# Patient Record
Sex: Female | Born: 1990
Health system: Southern US, Community
[De-identification: ages and names within clinical notes are randomized; demographics above are authoritative.]

## PROBLEM LIST (undated history)

## (undated) ENCOUNTER — Inpatient Hospital Stay (HOSPITAL_COMMUNITY): Payer: Self-pay

## (undated) DIAGNOSIS — D573 Sickle-cell trait: Secondary | ICD-10-CM

## (undated) DIAGNOSIS — N390 Urinary tract infection, site not specified: Secondary | ICD-10-CM

## (undated) DIAGNOSIS — Z789 Other specified health status: Secondary | ICD-10-CM

## (undated) DIAGNOSIS — N12 Tubulo-interstitial nephritis, not specified as acute or chronic: Secondary | ICD-10-CM

## (undated) DIAGNOSIS — IMO0002 Reserved for concepts with insufficient information to code with codable children: Secondary | ICD-10-CM

## (undated) DIAGNOSIS — R87619 Unspecified abnormal cytological findings in specimens from cervix uteri: Secondary | ICD-10-CM

## (undated) HISTORY — DX: Reserved for concepts with insufficient information to code with codable children: IMO0002

## (undated) HISTORY — DX: Unspecified abnormal cytological findings in specimens from cervix uteri: R87.619

## (undated) HISTORY — PX: COLPOSCOPY: SHX161

## (undated) HISTORY — DX: Sickle-cell trait: D57.3

---
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
## 2009-09-04 ENCOUNTER — Inpatient Hospital Stay (HOSPITAL_COMMUNITY): Admission: AD | Admit: 2009-09-04 | Discharge: 2009-09-04 | Payer: Self-pay | Admitting: Obstetrics & Gynecology

## 2009-09-05 ENCOUNTER — Inpatient Hospital Stay (HOSPITAL_COMMUNITY): Admission: AD | Admit: 2009-09-05 | Discharge: 2009-09-07 | Payer: Self-pay | Admitting: Obstetrics

## 2009-09-12 ENCOUNTER — Inpatient Hospital Stay (HOSPITAL_COMMUNITY): Admission: AD | Admit: 2009-09-12 | Discharge: 2009-09-16 | Payer: Self-pay | Admitting: Obstetrics

## 2010-04-14 ENCOUNTER — Emergency Department (HOSPITAL_COMMUNITY)
Admission: EM | Admit: 2010-04-14 | Discharge: 2010-04-14 | Payer: Self-pay | Source: Home / Self Care | Admitting: Emergency Medicine

## 2010-07-07 LAB — URINALYSIS, ROUTINE W REFLEX MICROSCOPIC
Bilirubin Urine: NEGATIVE
Glucose, UA: NEGATIVE mg/dL
Protein, ur: NEGATIVE mg/dL
Urobilinogen, UA: 0.2 mg/dL (ref 0.0–1.0)

## 2010-07-14 LAB — DIFFERENTIAL
Basophils Absolute: 0 10*3/uL (ref 0.0–0.1)
Basophils Relative: 0 % (ref 0–1)
Lymphocytes Relative: 21 % (ref 12–46)
Neutro Abs: 6.3 10*3/uL (ref 1.7–7.7)
Neutrophils Relative %: 66 % (ref 43–77)

## 2010-07-14 LAB — COMPREHENSIVE METABOLIC PANEL
ALT: 26 U/L (ref 0–35)
AST: 20 U/L (ref 0–37)
Albumin: 2.5 g/dL — ABNORMAL LOW (ref 3.5–5.2)
Alkaline Phosphatase: 108 U/L (ref 39–117)
BUN: 15 mg/dL (ref 6–23)
Chloride: 101 mEq/L (ref 96–112)
Chloride: 105 mEq/L (ref 96–112)
Creatinine, Ser: 1.38 mg/dL — ABNORMAL HIGH (ref 0.4–1.2)
GFR calc Af Amer: 59 mL/min — ABNORMAL LOW (ref >60)
Glucose, Bld: 120 mg/dL — ABNORMAL HIGH (ref 70–99)
Potassium: 3.5 mEq/L (ref 3.5–5.1)
Sodium: 133 mEq/L — ABNORMAL LOW (ref 135–145)
Total Bilirubin: 0.2 mg/dL — ABNORMAL LOW (ref 0.3–1.2)
Total Protein: 6.2 g/dL (ref 6.0–8.3)
Total Protein: 7.5 g/dL (ref 6.0–8.3)

## 2010-07-14 LAB — CBC
HCT: 25.9 % — ABNORMAL LOW (ref 36.0–46.0)
HCT: 30.9 % — ABNORMAL LOW (ref 36.0–46.0)
Hemoglobin: 8.9 g/dL — ABNORMAL LOW (ref 12.0–15.0)
MCHC: 34.6 g/dL (ref 30.0–36.0)
MCV: 92 fL (ref 78.0–100.0)
Platelets: 371 10*3/uL (ref 150–400)
RBC: 2.81 MIL/uL — ABNORMAL LOW (ref 3.87–5.11)
RBC: 2.87 MIL/uL — ABNORMAL LOW (ref 3.87–5.11)
RDW: 14.1 % (ref 11.5–15.5)
RDW: 14.5 % (ref 11.5–15.5)
RDW: 14.9 % (ref 11.5–15.5)
WBC: 17.5 10*3/uL — ABNORMAL HIGH (ref 4.0–10.5)
WBC: 25.3 10*3/uL — ABNORMAL HIGH (ref 4.0–10.5)

## 2010-07-14 LAB — URINALYSIS, ROUTINE W REFLEX MICROSCOPIC
Glucose, UA: NEGATIVE mg/dL
Protein, ur: 30 mg/dL — AB
Specific Gravity, Urine: 1.01 (ref 1.005–1.030)
Urobilinogen, UA: 0.2 mg/dL (ref 0.0–1.0)

## 2010-07-14 LAB — CULTURE, BLOOD (ROUTINE X 2): Culture: NO GROWTH

## 2010-07-14 LAB — BASIC METABOLIC PANEL
CO2: 22 mEq/L (ref 19–32)
Calcium: 8.2 mg/dL — ABNORMAL LOW (ref 8.4–10.5)
Creatinine, Ser: 0.81 mg/dL (ref 0.4–1.2)
GFR calc Af Amer: >60 mL/min (ref >60)
GFR calc non Af Amer: >60 mL/min (ref >60)
Glucose, Bld: 109 mg/dL — ABNORMAL HIGH (ref 70–99)

## 2010-07-14 LAB — URINE CULTURE: Colony Count: >=100000

## 2010-07-14 LAB — URINE MICROSCOPIC-ADD ON

## 2010-07-15 LAB — CBC
HCT: 30 % — ABNORMAL LOW (ref 36.0–46.0)
Hemoglobin: 10.4 g/dL — ABNORMAL LOW (ref 12.0–15.0)
Hemoglobin: 11.4 g/dL — ABNORMAL LOW (ref 12.0–15.0)
MCV: 93.7 fL (ref 78.0–100.0)
RBC: 3.2 MIL/uL — ABNORMAL LOW (ref 3.87–5.11)
RBC: 3.62 MIL/uL — ABNORMAL LOW (ref 3.87–5.11)
WBC: 20.6 10*3/uL — ABNORMAL HIGH (ref 4.0–10.5)

## 2010-10-14 ENCOUNTER — Inpatient Hospital Stay (INDEPENDENT_AMBULATORY_CARE_PROVIDER_SITE_OTHER)
Admission: RE | Admit: 2010-10-14 | Discharge: 2010-10-14 | Disposition: A | Discharge status: Home / Self Care | Payer: Medicaid Other | Source: Ambulatory Visit | Attending: Family Medicine | Admitting: Family Medicine

## 2010-10-14 DIAGNOSIS — N898 Other specified noninflammatory disorders of vagina: Secondary | ICD-10-CM

## 2010-10-14 DIAGNOSIS — N390 Urinary tract infection, site not specified: Secondary | ICD-10-CM

## 2010-10-14 LAB — POCT URINALYSIS DIP (DEVICE)
Hgb urine dipstick: NEGATIVE
Ketones, ur: NEGATIVE mg/dL
Protein, ur: NEGATIVE mg/dL
Specific Gravity, Urine: 1.015 (ref 1.005–1.030)
pH: 6.5 (ref 5.0–8.0)

## 2010-10-14 LAB — WET PREP, GENITAL

## 2010-10-14 LAB — POCT PREGNANCY, URINE: Preg Test, Ur: NEGATIVE

## 2010-11-25 ENCOUNTER — Inpatient Hospital Stay (INDEPENDENT_AMBULATORY_CARE_PROVIDER_SITE_OTHER)
Admission: RE | Admit: 2010-11-25 | Discharge: 2010-11-25 | Disposition: A | Discharge status: Home / Self Care | Payer: Medicaid Other | Source: Ambulatory Visit | Attending: Family Medicine | Admitting: Family Medicine

## 2010-11-25 DIAGNOSIS — R6889 Other general symptoms and signs: Secondary | ICD-10-CM

## 2010-11-25 LAB — WET PREP, GENITAL: Yeast Wet Prep HPF POC: NONE SEEN

## 2010-11-26 LAB — POCT URINALYSIS DIP (DEVICE)
Hgb urine dipstick: NEGATIVE
Ketones, ur: NEGATIVE mg/dL
Protein, ur: NEGATIVE mg/dL
Specific Gravity, Urine: 1.015 (ref 1.005–1.030)
pH: 6.5 (ref 5.0–8.0)

## 2010-11-26 LAB — POCT PREGNANCY, URINE: Preg Test, Ur: NEGATIVE

## 2010-12-03 ENCOUNTER — Inpatient Hospital Stay (INDEPENDENT_AMBULATORY_CARE_PROVIDER_SITE_OTHER)
Admission: RE | Admit: 2010-12-03 | Discharge: 2010-12-03 | Disposition: A | Discharge status: Home / Self Care | Payer: Medicaid Other | Source: Ambulatory Visit | Attending: Emergency Medicine | Admitting: Emergency Medicine

## 2010-12-03 DIAGNOSIS — J069 Acute upper respiratory infection, unspecified: Secondary | ICD-10-CM

## 2011-02-05 LAB — POCT URINALYSIS DIP (DEVICE)
Bilirubin Urine: NEGATIVE
Glucose, UA: NEGATIVE
Ketones, ur: NEGATIVE
Operator id: 116391
Specific Gravity, Urine: 1.025
Urobilinogen, UA: 1

## 2011-02-05 LAB — GC/CHLAMYDIA PROBE AMP, GENITAL
Chlamydia, DNA Probe: NEGATIVE
GC Probe Amp, Genital: NEGATIVE

## 2011-02-05 LAB — WET PREP, GENITAL

## 2011-02-05 LAB — POCT PREGNANCY, URINE
Operator id: 116391
Preg Test, Ur: NEGATIVE

## 2011-04-28 NOTE — L&D Delivery Note (Signed)
Delivery Note At 12:57 PM a viable unspecified sex was delivered via Vaginal, Spontaneous Delivery (Presentation: ;  ).  APGAR: , ; weight .   Placenta status: Intact, Spontaneous.  Cord: 3 vessels with the following complications: .  Cord pH: not done  Anesthesia: Epidural  Episiotomy:  Lacerations:  Suture Repair: 2.0 Est. Blood Loss (mL):   Mom to postpartum.  Baby to nursery-stable.  MARSHALL,BERNARD A 12/06/2011, 1:02 PM

## 2011-05-12 LAB — OB RESULTS CONSOLE RPR: RPR: NONREACTIVE

## 2011-05-12 LAB — OB RESULTS CONSOLE ABO/RH: RH Type: POSITIVE

## 2011-05-12 LAB — OB RESULTS CONSOLE HIV ANTIBODY (ROUTINE TESTING): HIV: NONREACTIVE

## 2011-07-06 ENCOUNTER — Other Ambulatory Visit: Payer: Self-pay | Admitting: Obstetrics

## 2011-07-06 DIAGNOSIS — Z3689 Encounter for other specified antenatal screening: Secondary | ICD-10-CM

## 2011-07-10 ENCOUNTER — Ambulatory Visit (HOSPITAL_COMMUNITY): Payer: Medicaid Other

## 2011-07-14 ENCOUNTER — Ambulatory Visit (HOSPITAL_COMMUNITY)
Admission: RE | Admit: 2011-07-14 | Discharge: 2011-07-14 | Disposition: A | Discharge status: Home / Self Care | Payer: Medicaid Other | Source: Ambulatory Visit | Attending: Obstetrics | Admitting: Obstetrics

## 2011-07-14 DIAGNOSIS — Z1389 Encounter for screening for other disorder: Secondary | ICD-10-CM | POA: Insufficient documentation

## 2011-07-14 DIAGNOSIS — Z3689 Encounter for other specified antenatal screening: Secondary | ICD-10-CM

## 2011-07-14 DIAGNOSIS — Z363 Encounter for antenatal screening for malformations: Secondary | ICD-10-CM | POA: Insufficient documentation

## 2011-07-14 DIAGNOSIS — O358XX0 Maternal care for other (suspected) fetal abnormality and damage, not applicable or unspecified: Secondary | ICD-10-CM | POA: Insufficient documentation

## 2011-09-11 ENCOUNTER — Inpatient Hospital Stay (HOSPITAL_COMMUNITY)
Admission: AD | Admit: 2011-09-11 | Discharge: 2011-09-11 | Disposition: A | Discharge status: Home / Self Care | Payer: Medicaid Other | Source: Ambulatory Visit | Attending: Obstetrics | Admitting: Obstetrics

## 2011-09-11 ENCOUNTER — Encounter (HOSPITAL_COMMUNITY): Payer: Self-pay | Admitting: *Deleted

## 2011-09-11 DIAGNOSIS — O98919 Unspecified maternal infectious and parasitic disease complicating pregnancy, unspecified trimester: Secondary | ICD-10-CM

## 2011-09-11 DIAGNOSIS — J40 Bronchitis, not specified as acute or chronic: Secondary | ICD-10-CM

## 2011-09-11 DIAGNOSIS — R05 Cough: Secondary | ICD-10-CM | POA: Insufficient documentation

## 2011-09-11 DIAGNOSIS — B999 Unspecified infectious disease: Secondary | ICD-10-CM

## 2011-09-11 DIAGNOSIS — J209 Acute bronchitis, unspecified: Secondary | ICD-10-CM | POA: Insufficient documentation

## 2011-09-11 DIAGNOSIS — O99891 Other specified diseases and conditions complicating pregnancy: Secondary | ICD-10-CM | POA: Insufficient documentation

## 2011-09-11 DIAGNOSIS — R059 Cough, unspecified: Secondary | ICD-10-CM | POA: Insufficient documentation

## 2011-09-11 DIAGNOSIS — IMO0002 Reserved for concepts with insufficient information to code with codable children: Secondary | ICD-10-CM

## 2011-09-11 MED ORDER — AZITHROMYCIN 250 MG PO TABS: ORAL_TABLET | ORAL | Status: DC

## 2011-09-11 MED ORDER — AZITHROMYCIN 250 MG PO TABS: ORAL_TABLET | ORAL | Status: AC

## 2011-09-11 MED ORDER — GUAIFENESIN-CODEINE 100-10 MG/5ML PO SYRP: 5.0000 mL | ORAL_SOLUTION | Freq: Three times a day (TID) | ORAL | Status: AC | PRN

## 2011-09-11 NOTE — Discharge Instructions (Signed)
Take the medication as directed and follow up with Dr. Clearance Coots. Be sure you are drinking plenty of fluids.  Bronchitis Bronchitis is a problem of the air tubes leading to your lungs. This problem makes it hard for air to get in and out of the lungs. You may cough a lot because your air tubes are narrow. Going without care can cause lasting (chronic) bronchitis. HOME CARE   Drink enough fluids to keep your pee (urine) clear or pale yellow.   Use a cool mist humidifier.   Quit smoking if you smoke. If you keep smoking, the bronchitis might not get better.   Only take medicine as told by your doctor.  GET HELP RIGHT AWAY IF:   Coughing keeps you awake.   You start to wheeze.   You become more sick or weak.   You have a hard time breathing or get short of breath.   You cough up blood.   Coughing lasts more than 2 weeks.   You have a fever.   Your baby is older than 3 months with a rectal temperature of 102 F (38.9 C) or higher.   Your baby is 30 months old or younger with a rectal temperature of 100.4 F (38 C) or higher.  MAKE SURE YOU:  Understand these instructions.   Will watch your condition.   Will get help right away if you are not doing well or get worse.  Document Released: 09/30/2007 Document Revised: 04/02/2011 Document Reviewed: 03/15/2009 [facility] Patient Information 2012 [place], [place].

## 2011-09-11 NOTE — MAU Note (Signed)
Pt states she started with sore throat and progressed to runny nose, chest pains cough and body aches

## 2011-09-11 NOTE — MAU Note (Signed)
PT CALLED OFFICE - TO ASK ABOUT DRINKING A CERTAIN TYPE OF TEA.    HAS HAD HEAD CONGESTION - STARTED ON Sunday- RUNNY NOSE, SORE THROAT, BODY ACHES, COUGHING,. NO N/V/D/.

## 2011-09-11 NOTE — MAU Provider Note (Signed)
History     CSN: [id number]  Arrival date & time 09/11/11  0006   None     Chief Complaint  Patient presents with  . URI    HPI Connie Duffy is a 21 y.o. female @ [redacted]w[redacted]d gestation who presents to MAU for cough, cold and congestion that started 5 days ago. She started with a sore throat and then began the cough and nasal drainage. Denies fever or chills. Denies any problems with the pregnancy. The history was provided by the patient.  History reviewed. No pertinent past medical history.  Past Surgical History  Procedure Date  . No past surgeries     No family history on file.  History  Substance Use Topics  . Smoking status: Former Smoker    Quit date: 09/10/2009  . Smokeless tobacco: Not on file  . Alcohol Use: No    OB History    Grav Para Term Preterm Abortions TAB SAB Ect Mult Living   2 1 1       1       Review of Systems  Constitutional: Negative for fever, chills, diaphoresis and fatigue.  HENT: Positive for congestion, sore throat and postnasal drip. Negative for ear pain, facial swelling, neck pain, neck stiffness, dental problem and sinus pressure.   Eyes: Negative for photophobia, pain and discharge.  Respiratory: Positive for cough. Negative for chest tightness and wheezing.   Gastrointestinal: Negative for nausea, vomiting, abdominal pain, diarrhea, constipation and abdominal distention.  Genitourinary: Negative for dysuria, urgency, frequency, flank pain, vaginal bleeding, vaginal discharge, difficulty urinating and pelvic pain.  Musculoskeletal: Negative for myalgias, back pain and gait problem.  Skin: Negative for color change and rash.  Neurological: Positive for headaches. Negative for dizziness, speech difficulty, weakness, light-headedness and numbness.  Psychiatric/Behavioral: Negative for confusion and agitation. The patient is not nervous/anxious.     Allergies  Review of patient's allergies indicates no known allergies.  Home Medications     Current Outpatient Rx  Name Route Sig Dispense Refill  . AZITHROMYCIN 250 MG PO TABS  Take 2 tablets PO the first day and then one tablet daily for infection 6 each 0  . GUAIFENESIN-CODEINE 100-10 MG/5ML PO SYRP Oral Take 5 mLs by mouth 3 (three) times daily as needed for cough. 120 mL 0    BP 114/64  Pulse 102  Temp(Src) 98.5 F (36.9 C) (Oral)  Resp 20  Ht 5\' 2"  (1.575 m)  Wt 148 lb (67.132 kg)  BMI 27.07 kg/m2  SpO2 100%  Physical Exam  Nursing note and vitals reviewed. Constitutional: She is oriented to person, place, and time. She appears well-developed and well-nourished. No distress.  HENT:  Head: Normocephalic.  Mouth/Throat: Uvula is midline, oropharynx is clear and moist and mucous membranes are normal. No posterior oropharyngeal edema or posterior oropharyngeal erythema.  Eyes: EOM are normal.  Neck: Trachea normal and normal range of motion. Neck supple.  Cardiovascular:       tachycardia  Pulmonary/Chest: Effort normal. No respiratory distress. She has decreased breath sounds. She has no wheezes.       Occasional ronchi  Abdominal: Soft. There is no tenderness.       Gravid consistent with dates  Musculoskeletal: Normal range of motion.  Lymphadenopathy:    She has no cervical adenopathy.  Neurological: She is alert and oriented to person, place, and time. No cranial nerve deficit.  Skin: Skin is warm and dry.  Psychiatric: She has a normal mood and  affect. Her behavior is normal. Judgment and thought content normal.    EFM: Base line FH 150, no contractions, reactive  Assessment:  Acute Bronchitis   Pregnancy  Plan:  Z-Pak Rx   Robitussin AC Rx   Follow up with Dr. Clearance Coots, return here as needed. I have reviewed this patient's vital signs, nurses notes, appropriate labs.   ED Course  Procedures   MDM

## 2011-09-11 NOTE — Progress Notes (Signed)
Pt states when she coughs she gets a bad headache and bad chest pains

## 2011-11-05 LAB — OB RESULTS CONSOLE GBS: GBS: POSITIVE

## 2011-12-03 ENCOUNTER — Telehealth (HOSPITAL_COMMUNITY): Payer: Self-pay | Admitting: *Deleted

## 2011-12-03 ENCOUNTER — Other Ambulatory Visit: Payer: Self-pay | Admitting: Obstetrics

## 2011-12-03 ENCOUNTER — Encounter (HOSPITAL_COMMUNITY): Payer: Self-pay | Admitting: *Deleted

## 2011-12-03 NOTE — Telephone Encounter (Signed)
Preadmission screen  

## 2011-12-06 ENCOUNTER — Inpatient Hospital Stay (HOSPITAL_COMMUNITY): Payer: Medicaid Other | Admitting: Anesthesiology

## 2011-12-06 ENCOUNTER — Encounter (HOSPITAL_COMMUNITY): Payer: Self-pay | Admitting: Anesthesiology

## 2011-12-06 ENCOUNTER — Inpatient Hospital Stay (HOSPITAL_COMMUNITY)
Admission: AD | Admit: 2011-12-06 | Discharge: 2011-12-08 | DRG: 775 | Disposition: A | Discharge status: Home / Self Care | Payer: Medicaid Other | Source: Ambulatory Visit | Attending: Obstetrics | Admitting: Obstetrics

## 2011-12-06 ENCOUNTER — Encounter (HOSPITAL_COMMUNITY): Payer: Self-pay | Admitting: *Deleted

## 2011-12-06 DIAGNOSIS — O99892 Other specified diseases and conditions complicating childbirth: Principal | ICD-10-CM | POA: Diagnosis present

## 2011-12-06 DIAGNOSIS — Z22330 Carrier of Group B streptococcus: Secondary | ICD-10-CM

## 2011-12-06 LAB — RPR: RPR Ser Ql: NONREACTIVE

## 2011-12-06 LAB — CBC
MCH: 27.4 pg (ref 26.0–34.0)
MCHC: 32.7 g/dL (ref 30.0–36.0)
Platelets: 246 10*3/uL (ref 150–400)
RDW: 14 % (ref 11.5–15.5)

## 2011-12-06 MED ORDER — PHENYLEPHRINE 40 MCG/ML (10ML) SYRINGE FOR IV PUSH (FOR BLOOD PRESSURE SUPPORT)
80.0000 ug | PREFILLED_SYRINGE | INTRAVENOUS | Status: DC | PRN
  Filled 2011-12-06: qty 5

## 2011-12-06 MED ORDER — OXYTOCIN BOLUS FROM INFUSION
250.0000 mL | Freq: Once | INTRAVENOUS | Status: AC
  Administered 2011-12-06: 250 mL via INTRAVENOUS
  Filled 2011-12-06: qty 500

## 2011-12-06 MED ORDER — LIDOCAINE HCL (PF) 1 % IJ SOLN
INTRAMUSCULAR | Status: DC | PRN
  Administered 2011-12-06 (×2): 5 mL

## 2011-12-06 MED ORDER — PRENATAL MULTIVITAMIN CH
1.0000 | ORAL_TABLET | Freq: Every day | ORAL | Status: DC
  Administered 2011-12-06 – 2011-12-08 (×3): 1 via ORAL
  Filled 2011-12-06 (×3): qty 1

## 2011-12-06 MED ORDER — EPHEDRINE 5 MG/ML INJ: 10.0000 mg | INTRAVENOUS | Status: DC | PRN

## 2011-12-06 MED ORDER — ACETAMINOPHEN 325 MG PO TABS: 650.0000 mg | ORAL_TABLET | ORAL | Status: DC | PRN

## 2011-12-06 MED ORDER — MIDAZOLAM HCL 2 MG/2ML IJ SOLN: 0.5000 mg | Freq: Once | INTRAMUSCULAR | Status: AC | PRN

## 2011-12-06 MED ORDER — LIDOCAINE HCL (PF) 1 % IJ SOLN
30.0000 mL | INTRAMUSCULAR | Status: DC | PRN
  Filled 2011-12-06: qty 30

## 2011-12-06 MED ORDER — MEPERIDINE HCL 25 MG/ML IJ SOLN: 6.2500 mg | INTRAMUSCULAR | Status: DC | PRN

## 2011-12-06 MED ORDER — OXYTOCIN 40 UNITS IN LACTATED RINGERS INFUSION - SIMPLE MED
62.5000 mL/h | Freq: Once | INTRAVENOUS | Status: AC
  Administered 2011-12-06: 62.5 mL/h via INTRAVENOUS
  Filled 2011-12-06: qty 1000

## 2011-12-06 MED ORDER — KETOROLAC TROMETHAMINE 30 MG/ML IJ SOLN: 15.0000 mg | Freq: Once | INTRAMUSCULAR | Status: AC | PRN

## 2011-12-06 MED ORDER — ONDANSETRON HCL 4 MG/2ML IJ SOLN: 4.0000 mg | INTRAMUSCULAR | Status: DC | PRN

## 2011-12-06 MED ORDER — DIBUCAINE 1 % RE OINT: 1.0000 "application " | TOPICAL_OINTMENT | RECTAL | Status: DC | PRN

## 2011-12-06 MED ORDER — DIPHENHYDRAMINE HCL 50 MG/ML IJ SOLN: 12.5000 mg | INTRAMUSCULAR | Status: DC | PRN

## 2011-12-06 MED ORDER — LACTATED RINGERS IV SOLN: 500.0000 mL | Freq: Once | INTRAVENOUS | Status: DC

## 2011-12-06 MED ORDER — PENICILLIN G POTASSIUM 5000000 UNITS IJ SOLR
5.0000 10*6.[IU] | Freq: Once | INTRAVENOUS | Status: AC
  Administered 2011-12-06: 5 10*6.[IU] via INTRAVENOUS
  Filled 2011-12-06: qty 5

## 2011-12-06 MED ORDER — OXYCODONE-ACETAMINOPHEN 5-325 MG PO TABS: 1.0000 | ORAL_TABLET | ORAL | Status: DC | PRN

## 2011-12-06 MED ORDER — FENTANYL 2.5 MCG/ML BUPIVACAINE 1/10 % EPIDURAL INFUSION (WH - ANES)
14.0000 mL/h | INTRAMUSCULAR | Status: DC
  Administered 2011-12-06 (×3): 14 mL/h via EPIDURAL
  Filled 2011-12-06 (×3): qty 60

## 2011-12-06 MED ORDER — SENNOSIDES-DOCUSATE SODIUM 8.6-50 MG PO TABS
2.0000 | ORAL_TABLET | Freq: Every day | ORAL | Status: DC
  Administered 2011-12-06 – 2011-12-07 (×2): 2 via ORAL

## 2011-12-06 MED ORDER — LACTATED RINGERS IV SOLN
INTRAVENOUS | Status: DC
  Administered 2011-12-06: 09:00:00 via INTRAVENOUS

## 2011-12-06 MED ORDER — PHENYLEPHRINE 40 MCG/ML (10ML) SYRINGE FOR IV PUSH (FOR BLOOD PRESSURE SUPPORT): 80.0000 ug | PREFILLED_SYRINGE | INTRAVENOUS | Status: DC | PRN

## 2011-12-06 MED ORDER — PENICILLIN G POTASSIUM 5000000 UNITS IJ SOLR
2.5000 10*6.[IU] | INTRAVENOUS | Status: DC
  Administered 2011-12-06 (×2): 2.5 10*6.[IU] via INTRAVENOUS
  Filled 2011-12-06 (×6): qty 2.5

## 2011-12-06 MED ORDER — LACTATED RINGERS IV SOLN
500.0000 mL | INTRAVENOUS | Status: DC | PRN
  Administered 2011-12-06: 500 mL via INTRAVENOUS

## 2011-12-06 MED ORDER — TETANUS-DIPHTH-ACELL PERTUSSIS 5-2.5-18.5 LF-MCG/0.5 IM SUSP
0.5000 mL | Freq: Once | INTRAMUSCULAR | Status: AC
  Administered 2011-12-07: 0.5 mL via INTRAMUSCULAR
  Filled 2011-12-06: qty 0.5

## 2011-12-06 MED ORDER — BENZOCAINE-MENTHOL 20-0.5 % EX AERO: 1.0000 "application " | INHALATION_SPRAY | CUTANEOUS | Status: DC | PRN

## 2011-12-06 MED ORDER — PROMETHAZINE HCL 25 MG/ML IJ SOLN: 6.2500 mg | INTRAMUSCULAR | Status: DC | PRN

## 2011-12-06 MED ORDER — FENTANYL CITRATE 0.05 MG/ML IJ SOLN: 25.0000 ug | INTRAMUSCULAR | Status: DC | PRN

## 2011-12-06 MED ORDER — IBUPROFEN 600 MG PO TABS
600.0000 mg | ORAL_TABLET | Freq: Four times a day (QID) | ORAL | Status: DC | PRN
  Filled 2011-12-06 (×6): qty 1

## 2011-12-06 MED ORDER — CITRIC ACID-SODIUM CITRATE 334-500 MG/5ML PO SOLN: 30.0000 mL | ORAL | Status: DC | PRN

## 2011-12-06 MED ORDER — ONDANSETRON HCL 4 MG PO TABS: 4.0000 mg | ORAL_TABLET | ORAL | Status: DC | PRN

## 2011-12-06 MED ORDER — IBUPROFEN 600 MG PO TABS
600.0000 mg | ORAL_TABLET | Freq: Four times a day (QID) | ORAL | Status: DC
  Administered 2011-12-06 – 2011-12-08 (×7): 600 mg via ORAL
  Filled 2011-12-06 (×2): qty 1

## 2011-12-06 MED ORDER — EPHEDRINE 5 MG/ML INJ
10.0000 mg | INTRAVENOUS | Status: DC | PRN
  Filled 2011-12-06: qty 4

## 2011-12-06 MED ORDER — SIMETHICONE 80 MG PO CHEW: 80.0000 mg | CHEWABLE_TABLET | ORAL | Status: DC | PRN

## 2011-12-06 MED ORDER — ONDANSETRON HCL 4 MG/2ML IJ SOLN: 4.0000 mg | Freq: Four times a day (QID) | INTRAMUSCULAR | Status: DC | PRN

## 2011-12-06 MED ORDER — ZOLPIDEM TARTRATE 5 MG PO TABS: 5.0000 mg | ORAL_TABLET | Freq: Every evening | ORAL | Status: DC | PRN

## 2011-12-06 MED ORDER — DIPHENHYDRAMINE HCL 25 MG PO CAPS: 25.0000 mg | ORAL_CAPSULE | Freq: Four times a day (QID) | ORAL | Status: DC | PRN

## 2011-12-06 MED ORDER — LANOLIN HYDROUS EX OINT: TOPICAL_OINTMENT | CUTANEOUS | Status: DC | PRN

## 2011-12-06 MED ORDER — WITCH HAZEL-GLYCERIN EX PADS: 1.0000 "application " | MEDICATED_PAD | CUTANEOUS | Status: DC | PRN

## 2011-12-06 MED ORDER — FLEET ENEMA 7-19 GM/118ML RE ENEM: 1.0000 | ENEMA | RECTAL | Status: DC | PRN

## 2011-12-06 MED ORDER — FERROUS SULFATE 325 (65 FE) MG PO TABS
325.0000 mg | ORAL_TABLET | Freq: Two times a day (BID) | ORAL | Status: DC
  Administered 2011-12-06 – 2011-12-08 (×4): 325 mg via ORAL
  Filled 2011-12-06 (×4): qty 1

## 2011-12-06 NOTE — MAU Note (Signed)
Started leaking fld about ago and then contractions started

## 2011-12-06 NOTE — Progress Notes (Signed)
Report called to Christy RN in BS. Pt to BS via w/c 

## 2011-12-06 NOTE — Anesthesia Preprocedure Evaluation (Signed)

## 2011-12-06 NOTE — Anesthesia Procedure Notes (Signed)
Epidural Patient location during procedure: OB Start time: 12/06/2011 4:05 AM  Staffing Anesthesiologist: Brayton Caves R Performed by: anesthesiologist   Preanesthetic Checklist Completed: patient identified, site marked, surgical consent, pre-op evaluation, timeout performed, IV checked, risks and benefits discussed and monitors and equipment checked  Epidural Patient position: sitting Prep: site prepped and draped and DuraPrep Patient monitoring: continuous pulse ox and blood pressure Approach: midline Injection technique: LOR air and LOR saline  Needle:  Needle type: Tuohy  Needle gauge: 17 G Needle length: 9 cm Needle insertion depth: 4 cm Catheter type: closed end flexible Catheter size: 19 Gauge Catheter at skin depth: 10 cm Test dose: negative  Assessment Events: blood not aspirated, injection not painful, no injection resistance, negative IV test and no paresthesia  Additional Notes Patient identified.  Risk benefits discussed including failed block, incomplete pain control, headache, nerve damage, paralysis, blood pressure changes, nausea, vomiting, reactions to medication both toxic or allergic, and postpartum back pain.  Patient expressed understanding and wished to proceed.  All questions were answered.  Sterile technique used throughout procedure and epidural site dressed with sterile barrier dressing. No paresthesia or other complications noted.The patient did not experience any signs of intravascular injection such as tinnitus or metallic taste in mouth nor signs of intrathecal spread such as rapid motor block. Please see nursing notes for vital signs.

## 2011-12-06 NOTE — H&P (Signed)
This is Dr. Francoise Ceo dictating the history and physical on  Connie Duffy is a 21 year old gravida 2 para 1001 at 40 weeks and 3 days her due date is 12/03/2011 patch is admitted with ruptured membranes clear fluid and him cervix 2-3 cm 80% with the vertex at a - station positive GBS she is receiving penicillin and she is contracting irregularly on are all Past medical history negative Past surgical history negative Social history negative System review noncontributory Physical exam revealed a well-developed female in labor HEENT negative Breasts negative Heart regular and and no murmurs no gallops Lungs clear to P&A Abdomen term Pelvic as described above Extremities negative

## 2011-12-07 ENCOUNTER — Inpatient Hospital Stay (HOSPITAL_COMMUNITY): Admission: RE | Admit: 2011-12-07 | Payer: Medicaid Other | Source: Ambulatory Visit

## 2011-12-07 LAB — CBC
MCH: 27.3 pg (ref 26.0–34.0)
MCHC: 33.2 g/dL (ref 30.0–36.0)
MCV: 82.3 fL (ref 78.0–100.0)
Platelets: 204 10*3/uL (ref 150–400)
RDW: 14.2 % (ref 11.5–15.5)
WBC: 21.2 10*3/uL — ABNORMAL HIGH (ref 4.0–10.5)

## 2011-12-07 NOTE — Progress Notes (Signed)
Post Partum Day 1 Subjective: no complaints  Objective: Blood pressure 117/64, pulse 102, temperature 98.1 F (36.7 C), temperature source Oral, resp. rate 18, height 5\' 2"  (1.575 m), weight 73.12 kg (161 lb 3.2 oz), SpO2 99.00%, unknown if currently breastfeeding.  Physical Exam:  General: alert and no distress Lochia: appropriate Uterine Fundus: firm Incision: healing well DVT Evaluation: No evidence of DVT seen on physical exam.   Basename 12/07/11 0510 12/06/11 0323  HGB 6.3* 9.0*  HCT 19.0* 27.5*    Assessment/Plan: Plan for discharge tomorrow   LOS: 1 day   HARPER,CHARLES A 12/07/2011, 8:52 AM

## 2011-12-07 NOTE — Anesthesia Postprocedure Evaluation (Signed)
  Anesthesia Post-op Note  Patient: Connie Duffy  Procedure(s) Performed: * No procedures listed *  Patient Location: PACU and Mother/Baby  Anesthesia Type: Epidural  Level of Consciousness: awake, alert  and oriented  Airway and Oxygen Therapy: Patient Spontanous Breathing  Post-op Pain: none  Post-op Assessment: Post-op Vital signs reviewed, Patient's Cardiovascular Status Stable, No headache, No backache, No residual numbness and No residual motor weakness  Post-op Vital Signs: Reviewed and stable  Complications: No apparent anesthesia complications

## 2011-12-07 NOTE — Progress Notes (Signed)
CRITICAL VALUE Alert Critical value received:  Hgb 6.3  Date of notification:  12-07-2011  Time of notification:  0600  Critical value read back:yes  Nurse who received alert:  Hart Carwin  MD notified (1st page):  N/A  Time of first page:  N/A  MD notified (2nd page):  Time of second page:  Responding MD:  N/A  Time MD responded:  N/A

## 2011-12-07 NOTE — Progress Notes (Signed)
Ur chart review completed.  

## 2011-12-08 MED ORDER — FUSION PLUS PO CAPS: 1.0000 | ORAL_CAPSULE | Freq: Every day | ORAL | Status: DC

## 2011-12-08 MED ORDER — OXYCODONE-ACETAMINOPHEN 5-325 MG PO TABS: 1.0000 | ORAL_TABLET | ORAL | Status: AC | PRN

## 2011-12-08 MED ORDER — IBUPROFEN 600 MG PO TABS: 600.0000 mg | ORAL_TABLET | Freq: Four times a day (QID) | ORAL | Status: DC

## 2011-12-08 NOTE — Discharge Summary (Signed)
Obstetric Discharge Summary Reason for Admission: onset of labor Prenatal Procedures: ultrasound Intrapartum Procedures: spontaneous vaginal delivery Postpartum Procedures: none Complications-Operative and Postpartum: none Hemoglobin  Date Value Range Status  12/07/2011 6.3* 12.0 - 15.0 g/dL Final     DELTA CHECK NOTED     REPEATED TO VERIFY     CRITICAL RESULT CALLED TO, READ BACK BY AND VERIFIED WITH:     J STANIN 12/07/11 AT 0556 BY H SOEWARDIMAN     HCT  Date Value Range Status  12/07/2011 19.0* 36.0 - 46.0 % Final    Physical Exam:  General: alert and no distress Lochia: appropriate Uterine Fundus: firm Incision: healing well DVT Evaluation: No evidence of DVT seen on physical exam.  Discharge Diagnoses: Term Pregnancy-delivered  Discharge Information: Date: 12/08/2011 Activity: pelvic rest Diet: routine Medications: PNV, Ibuprofen, Colace, Iron and Percocet Condition: stable Instructions: refer to practice specific booklet Discharge to: home Follow-up Information    Follow up with Riyansh Gerstner A, MD. Schedule an appointment as soon as possible for a visit in 6 weeks.   Contact information:   [address] [place] [address] [phone]          Newborn Data: Live born female  Birth Weight: 8 lb 13.8 oz (4020 g) APGAR: 8, 9  Home with mother.  Davontae Prusinski A 12/08/2011, 9:03 AM

## 2011-12-08 NOTE — Progress Notes (Signed)
Post Partum Day 2 Subjective: no complaints  Objective: Blood pressure 105/65, pulse 90, temperature 98 F (36.7 C), temperature source Oral, resp. rate 18, height 5\' 2"  (1.575 m), weight 73.12 kg (161 lb 3.2 oz), SpO2 100.00%, unknown if currently breastfeeding.  Physical Exam:  General: alert and no distress Lochia: appropriate Uterine Fundus: firm Incision: healing well DVT Evaluation: No evidence of DVT seen on physical exam.   Basename 12/07/11 0510 12/06/11 0323  HGB 6.3* 9.0*  HCT 19.0* 27.5*    Assessment/Plan: Discharge home   LOS: 2 days   Mishael Krysiak A 12/08/2011, 8:56 AM

## 2012-01-01 IMAGING — CR DG CHEST 2V
2 series · 2 of 2 positions shown · non-contrast
Comparison: None

CLINICAL DATA: Back pain, right posterior rib pain

CHEST - 2 VIEW

[w chest pa]
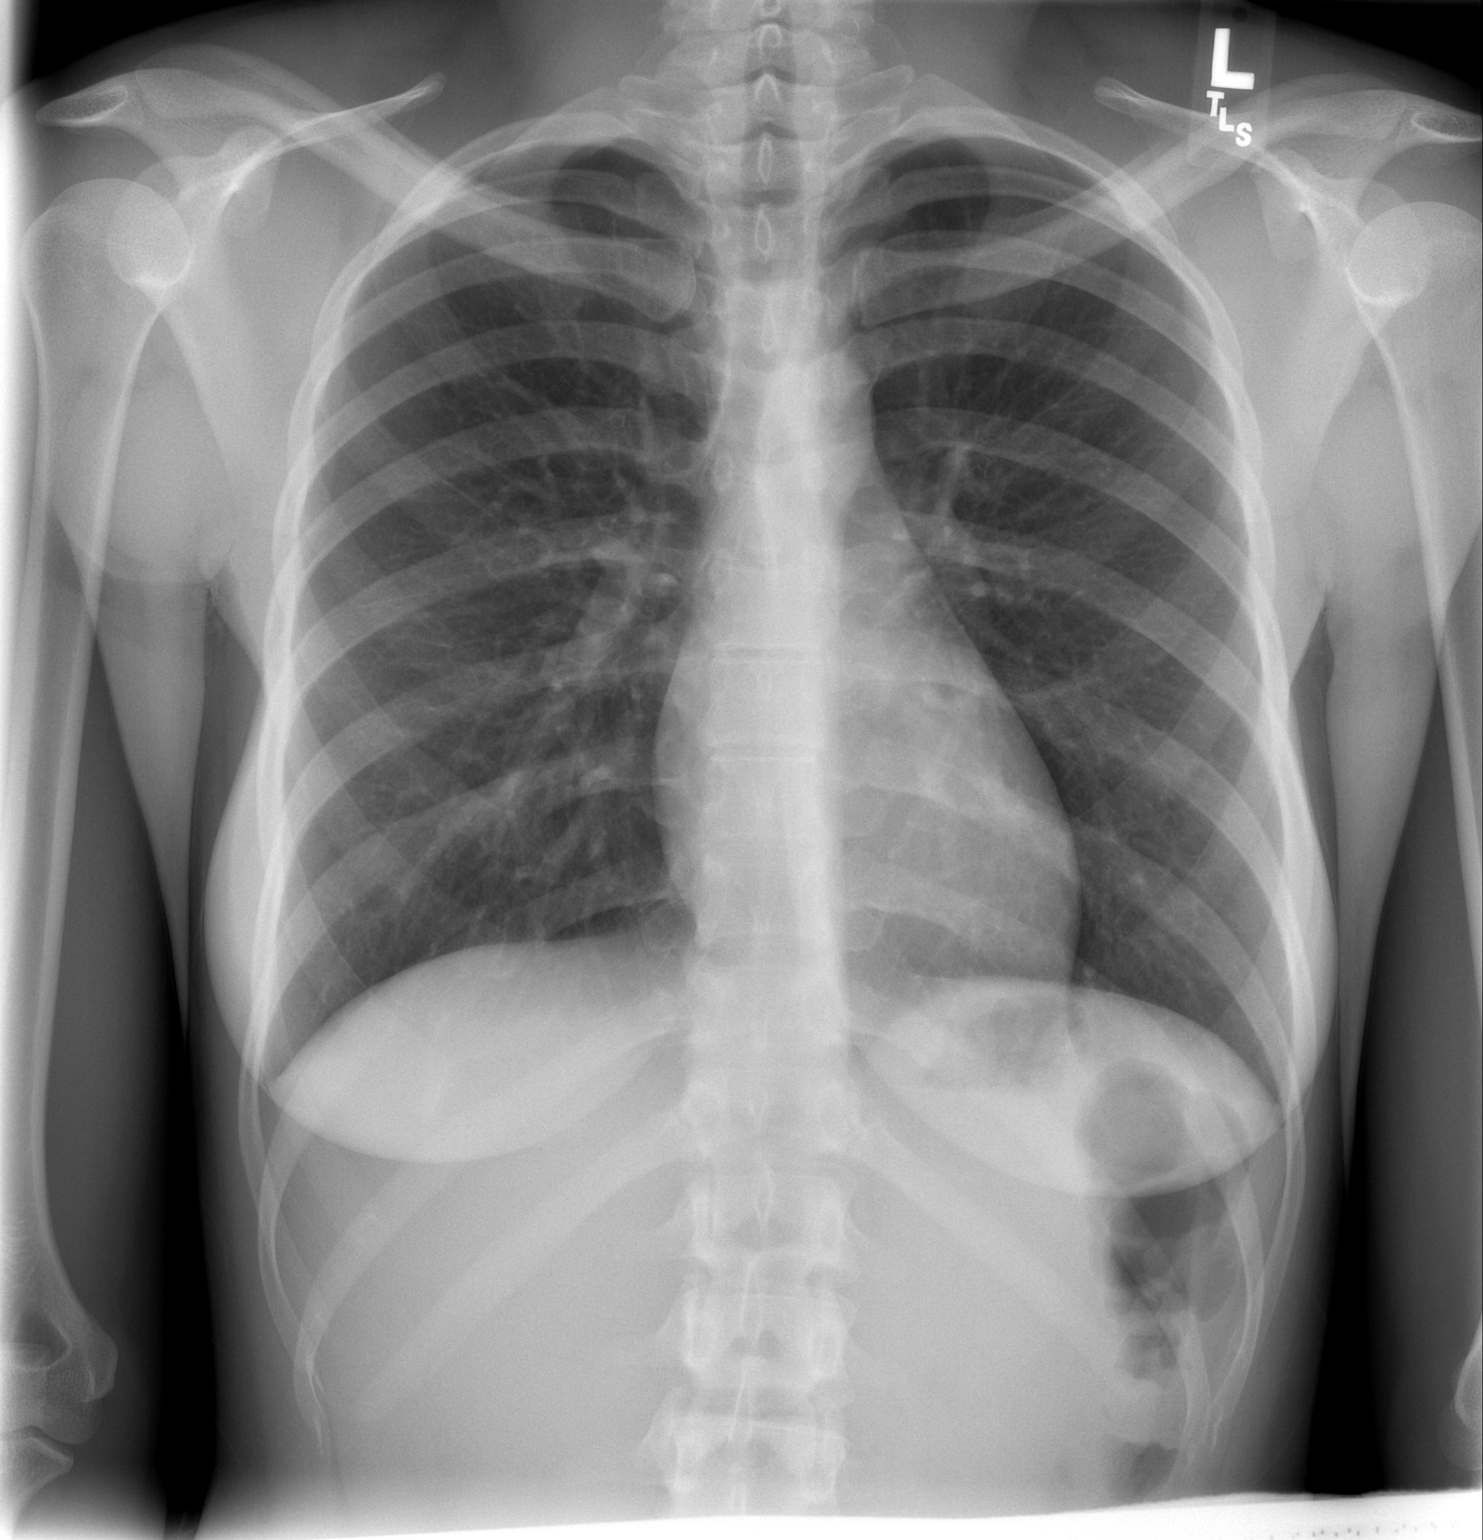

[w chest lat *]
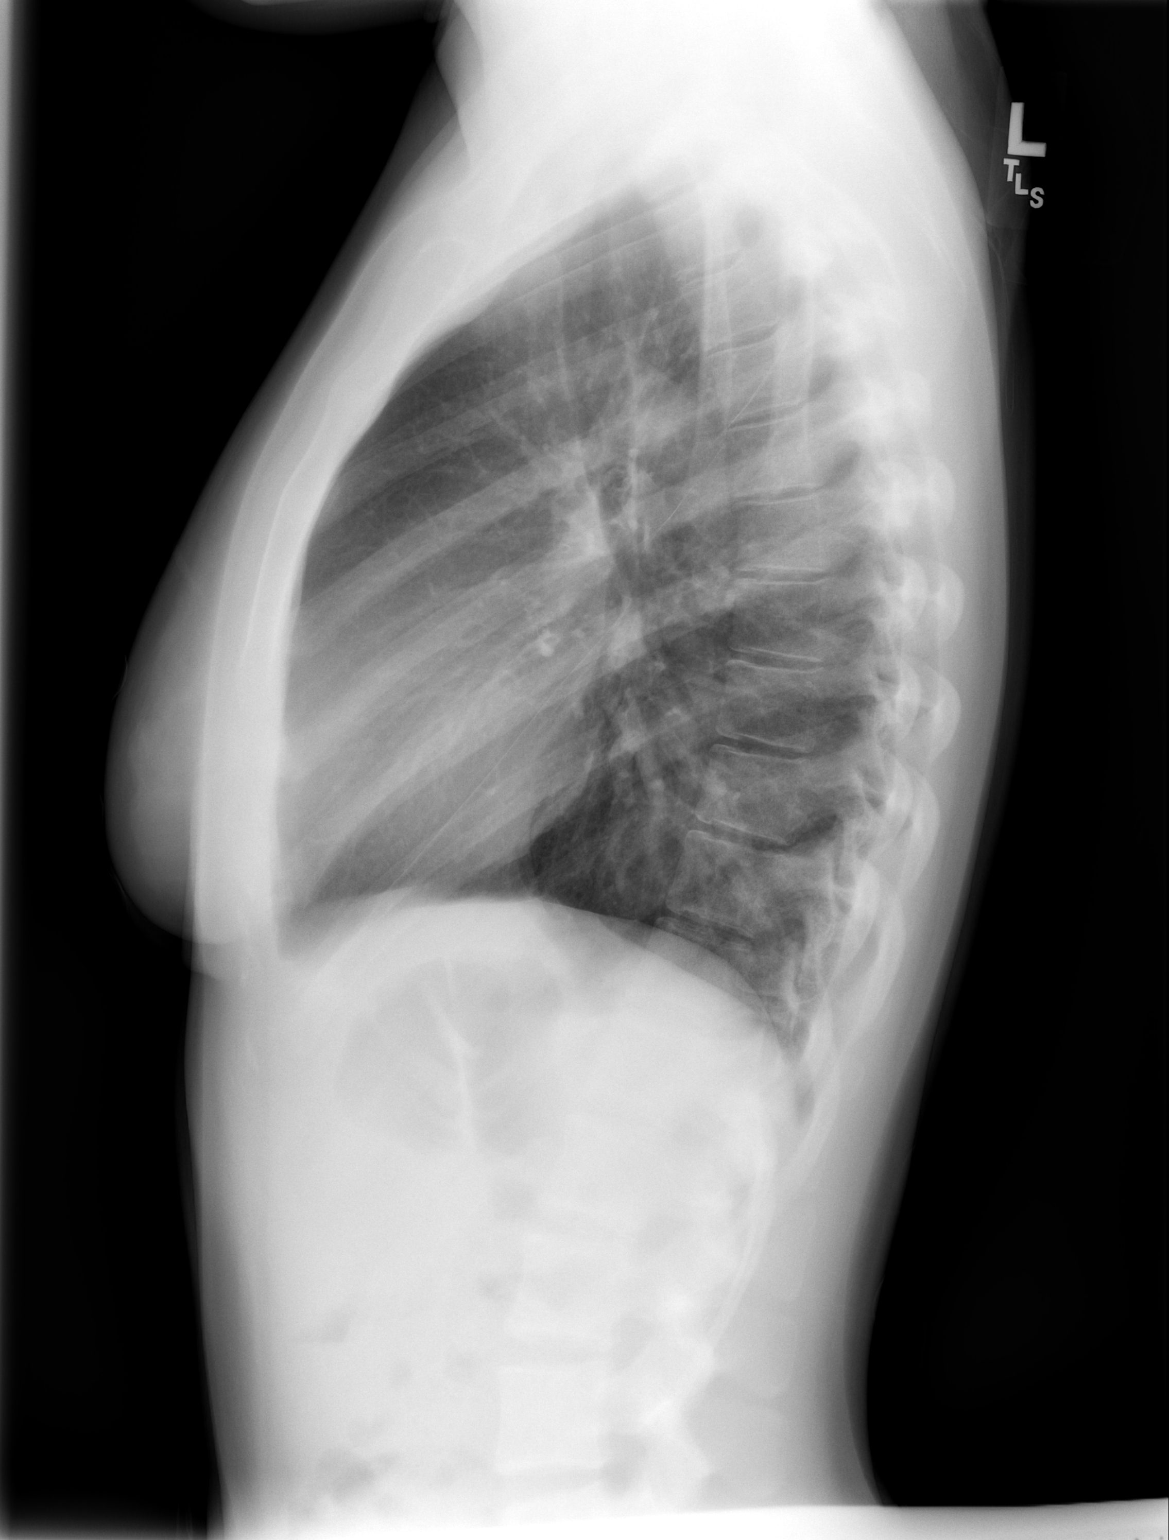

[2 of 2 positions shown; findings below may reference images not displayed]

FINDINGS: Normal heart size, mediastinal contours, and pulmonary vascularity.
Lungs clear.
Bones unremarkable.
No pneumothorax.
IMPRESSION: No acute abnormalities.

## 2012-10-23 ENCOUNTER — Encounter (HOSPITAL_COMMUNITY): Payer: Self-pay | Admitting: Emergency Medicine

## 2012-10-23 DIAGNOSIS — M546 Pain in thoracic spine: Secondary | ICD-10-CM | POA: Insufficient documentation

## 2012-10-23 DIAGNOSIS — D573 Sickle-cell trait: Secondary | ICD-10-CM | POA: Insufficient documentation

## 2012-10-23 DIAGNOSIS — M545 Low back pain, unspecified: Secondary | ICD-10-CM | POA: Insufficient documentation

## 2012-10-23 DIAGNOSIS — Z87891 Personal history of nicotine dependence: Secondary | ICD-10-CM | POA: Insufficient documentation

## 2012-10-23 DIAGNOSIS — Z8742 Personal history of other diseases of the female genital tract: Secondary | ICD-10-CM | POA: Insufficient documentation

## 2012-10-23 DIAGNOSIS — N390 Urinary tract infection, site not specified: Secondary | ICD-10-CM | POA: Insufficient documentation

## 2012-10-23 DIAGNOSIS — Z3202 Encounter for pregnancy test, result negative: Secondary | ICD-10-CM | POA: Insufficient documentation

## 2012-10-23 DIAGNOSIS — R51 Headache: Secondary | ICD-10-CM | POA: Insufficient documentation

## 2012-10-23 LAB — POCT PREGNANCY, URINE: Preg Test, Ur: NEGATIVE

## 2012-10-23 NOTE — ED Notes (Signed)
PT. REPORTS UPPER/MID/LOW BACK PAIN ONSET THIS MORNING , DENIES INJURY OR FALL , NO HEMATURIA OR URINARY SYMPTOMS.

## 2012-10-24 ENCOUNTER — Emergency Department (HOSPITAL_COMMUNITY)
Admission: EM | Admit: 2012-10-24 | Discharge: 2012-10-24 | Disposition: A | Discharge status: Home / Self Care | Payer: Self-pay | Attending: Emergency Medicine | Admitting: Emergency Medicine

## 2012-10-24 DIAGNOSIS — N390 Urinary tract infection, site not specified: Secondary | ICD-10-CM

## 2012-10-24 LAB — URINALYSIS, ROUTINE W REFLEX MICROSCOPIC
Nitrite: NEGATIVE
Specific Gravity, Urine: 1.009 (ref 1.005–1.030)
Urobilinogen, UA: 1 mg/dL (ref 0.0–1.0)

## 2012-10-24 LAB — URINE MICROSCOPIC-ADD ON

## 2012-10-24 MED ORDER — PHENAZOPYRIDINE HCL 95 MG PO TABS: 95.0000 mg | ORAL_TABLET | Freq: Three times a day (TID) | ORAL | Status: DC | PRN

## 2012-10-24 MED ORDER — PHENAZOPYRIDINE HCL 100 MG PO TABS
95.0000 mg | ORAL_TABLET | Freq: Once | ORAL | Status: AC
  Administered 2012-10-24: 100 mg via ORAL
  Filled 2012-10-24: qty 1

## 2012-10-24 MED ORDER — SULFAMETHOXAZOLE-TMP DS 800-160 MG PO TABS: 1.0000 | ORAL_TABLET | Freq: Two times a day (BID) | ORAL | Status: DC

## 2012-10-24 MED ORDER — SULFAMETHOXAZOLE-TMP DS 800-160 MG PO TABS
1.0000 | ORAL_TABLET | Freq: Once | ORAL | Status: AC
  Administered 2012-10-24: 1 via ORAL
  Filled 2012-10-24: qty 1

## 2012-10-24 NOTE — ED Provider Notes (Signed)
Medical screening examination/treatment/procedure(s) were performed by non-physician practitioner and as supervising physician I was immediately available for consultation/collaboration.   Sylas Twombly M Makai Dumond, MD 10/24/12 0500 

## 2012-10-24 NOTE — ED Notes (Signed)
Patient states that she had abdominal cramps on Thursday but that has subsided and now the pain in her back started this am. Pt states that she has had "kidney infections" in the past and this feels the same.

## 2012-10-24 NOTE — ED Provider Notes (Signed)
History    CSN: [id number] Arrival date & time 10/23/12  2334  First MD Initiated Contact with Patient 10/24/12 0005     Chief Complaint  Patient presents with  . Back Pain   (Consider location/radiation/quality/duration/timing/severity/associated sxs/prior Treatment) HPI Comments: Patient with dysuria for the past 3 days getting worse with headache, denies fever   Patient is a 22 y.o. female presenting with back pain. The history is provided by the patient.  Back Pain Location:  Thoracic spine and lumbar spine Quality:  Aching Radiates to:  Does not radiate Pain severity:  Moderate Onset quality:  Gradual Timing:  Constant Progression:  Worsening Chronicity:  New Context: not lifting heavy objects   Relieved by:  Nothing Worsened by:  Nothing tried Associated symptoms: dysuria   Associated symptoms: no bladder incontinence, no bowel incontinence, no fever, no headaches, no numbness, no pelvic pain and no weakness    Past Medical History  Diagnosis Date  . Sickle cell trait   . Abnormal Pap smear    Past Surgical History  Procedure Laterality Date  . Colposcopy     Family History  Problem Relation Age of Onset  . Diabetes Maternal Aunt   . Diabetes Maternal Grandfather    History  Substance Use Topics  . Smoking status: Former Smoker    Quit date: 09/10/2009  . Smokeless tobacco: Never Used  . Alcohol Use: No   OB History   Grav Para Term Preterm Abortions TAB SAB Ect Mult Living   2 2 2       2      Review of Systems  Constitutional: Negative for fever.  Gastrointestinal: Negative for bowel incontinence.  Genitourinary: Positive for dysuria. Negative for bladder incontinence, frequency, flank pain and pelvic pain.  Musculoskeletal: Positive for back pain.  Skin: Negative for wound.  Neurological: Negative for weakness, numbness and headaches.    Allergies  Review of patient's allergies indicates no known allergies.  Home Medications  No current  outpatient prescriptions on file. BP 126/61  Pulse 104  Temp(Src) 99.2 F (37.3 C) (Oral)  Resp 17  SpO2 100%  LMP 10/11/2012 Physical Exam  Nursing note and vitals reviewed. Constitutional: She is oriented to person, place, and time. She appears well-developed and well-nourished.  HENT:  Head: Normocephalic.  Eyes: Pupils are equal, round, and reactive to light.  Neck: Normal range of motion.  Cardiovascular: Normal rate and regular rhythm.   Pulmonary/Chest: Effort normal and breath sounds normal.  Abdominal: Soft.  Musculoskeletal: Normal range of motion.  Neurological: She is alert and oriented to person, place, and time.  Skin: Skin is warm and dry.    ED Course  Procedures (including critical care time) Labs Reviewed  URINALYSIS, ROUTINE W REFLEX MICROSCOPIC - Abnormal; Notable for the following:    APPearance TURBID (*)    Hgb urine dipstick MODERATE (*)    Ketones, ur 15 (*)    Protein, ur 30 (*)    Leukocytes, UA LARGE (*)    All other components within normal limits  URINE MICROSCOPIC-ADD ON - Abnormal; Notable for the following:    Squamous Epithelial / LPF FEW (*)    Bacteria, UA MANY (*)    All other components within normal limits  URINE CULTURE  POCT PREGNANCY, URINE   No results found. 1. UTI (lower urinary tract infection)     MDM   Patient with UTI will treat with Septra and pyridium   Arman Filter, NP 10/24/12 0050  Arman Filter, NP 10/24/12 [phone]

## 2012-10-25 LAB — URINE CULTURE: Colony Count: >=100000

## 2012-10-26 NOTE — ED Notes (Signed)
+   Urine Patient treated with Sulfa-TMP-STS-Chart appended per protocol MD.

## 2013-02-20 ENCOUNTER — Encounter (HOSPITAL_COMMUNITY): Payer: Self-pay | Admitting: Emergency Medicine

## 2013-02-20 ENCOUNTER — Emergency Department (HOSPITAL_COMMUNITY)
Admission: EM | Admit: 2013-02-20 | Discharge: 2013-02-20 | Disposition: A | Discharge status: Home / Self Care | Payer: Medicaid Other | Attending: Emergency Medicine | Admitting: Emergency Medicine

## 2013-02-20 DIAGNOSIS — Z862 Personal history of diseases of the blood and blood-forming organs and certain disorders involving the immune mechanism: Secondary | ICD-10-CM | POA: Insufficient documentation

## 2013-02-20 DIAGNOSIS — Z87891 Personal history of nicotine dependence: Secondary | ICD-10-CM | POA: Insufficient documentation

## 2013-02-20 DIAGNOSIS — R110 Nausea: Secondary | ICD-10-CM | POA: Insufficient documentation

## 2013-02-20 DIAGNOSIS — N12 Tubulo-interstitial nephritis, not specified as acute or chronic: Secondary | ICD-10-CM

## 2013-02-20 DIAGNOSIS — Z3202 Encounter for pregnancy test, result negative: Secondary | ICD-10-CM | POA: Insufficient documentation

## 2013-02-20 LAB — URINALYSIS, ROUTINE W REFLEX MICROSCOPIC
Bilirubin Urine: NEGATIVE
Ketones, ur: 15 mg/dL — AB
Nitrite: POSITIVE — AB
Protein, ur: 100 mg/dL — AB

## 2013-02-20 LAB — COMPREHENSIVE METABOLIC PANEL
ALT: 10 U/L (ref 0–35)
Albumin: 4.2 g/dL (ref 3.5–5.2)
Alkaline Phosphatase: 73 U/L (ref 39–117)
BUN: 8 mg/dL (ref 6–23)
Chloride: 97 mEq/L (ref 96–112)
Potassium: 3.7 mEq/L (ref 3.5–5.1)
Sodium: 133 mEq/L — ABNORMAL LOW (ref 135–145)
Total Bilirubin: 2 mg/dL — ABNORMAL HIGH (ref 0.3–1.2)

## 2013-02-20 LAB — CBC
HCT: 39.1 % (ref 36.0–46.0)
Hemoglobin: 13.7 g/dL (ref 12.0–15.0)
RDW: 13.1 % (ref 11.5–15.5)
WBC: 16 10*3/uL — ABNORMAL HIGH (ref 4.0–10.5)

## 2013-02-20 LAB — URINE MICROSCOPIC-ADD ON

## 2013-02-20 LAB — POCT PREGNANCY, URINE: Preg Test, Ur: NEGATIVE

## 2013-02-20 MED ORDER — IBUPROFEN 800 MG PO TABS
800.0000 mg | ORAL_TABLET | Freq: Once | ORAL | Status: AC
  Administered 2013-02-20: 800 mg via ORAL
  Filled 2013-02-20: qty 1

## 2013-02-20 MED ORDER — CIPROFLOXACIN HCL 500 MG PO TABS: 500.0000 mg | ORAL_TABLET | Freq: Two times a day (BID) | ORAL | Status: DC

## 2013-02-20 MED ORDER — PROMETHAZINE HCL 25 MG PO TABS: 25.0000 mg | ORAL_TABLET | Freq: Four times a day (QID) | ORAL | Status: DC | PRN

## 2013-02-20 MED ORDER — SODIUM CHLORIDE 0.9 % IV BOLUS (SEPSIS)
1000.0000 mL | Freq: Once | INTRAVENOUS | Status: AC
  Administered 2013-02-20: 1000 mL via INTRAVENOUS

## 2013-02-20 MED ORDER — DEXTROSE 5 % IV SOLN
1.0000 g | Freq: Once | INTRAVENOUS | Status: AC
  Administered 2013-02-20: 1 g via INTRAVENOUS
  Filled 2013-02-20: qty 10

## 2013-02-20 MED ORDER — ACETAMINOPHEN 500 MG PO TABS
1000.0000 mg | ORAL_TABLET | Freq: Once | ORAL | Status: AC
  Administered 2013-02-20: 1000 mg via ORAL
  Filled 2013-02-20: qty 2

## 2013-02-20 NOTE — ED Provider Notes (Signed)
CSN: [id number]     Arrival date & time 02/20/13  0932 History   First MD Initiated Contact with Patient 02/20/13 1014     Chief Complaint  Patient presents with  . Urinary Tract Infection   (Consider location/radiation/quality/duration/timing/severity/associated sxs/prior Treatment) HPI Comments: 22 year old female presents with subjective fevers, back pain, and a UTI. She has a history of sickle cell trait and multiple urinary tract infections. She states she's not taken her temperature but feels warm. For 3 days she's noticed darker and more smelly urine. She's also had burning at the end of urination. The similar to her prior urinary tract infections. She's complaining of diffuse back pain. She denies any abdominal pain. She did feel nauseous yesterday but has felt hungry today. She has a generalized headache which is fevers but otherwise denies neck pain or neck stiffness.  Patient is a 22 y.o. female presenting with urinary tract infection.  Urinary Tract Infection Pertinent negatives include no abdominal pain and no shortness of breath.    Past Medical History  Diagnosis Date  . Sickle cell trait   . Abnormal Pap smear    Past Surgical History  Procedure Laterality Date  . Colposcopy     Family History  Problem Relation Age of Onset  . Diabetes Maternal Aunt   . Diabetes Maternal Grandfather    History  Substance Use Topics  . Smoking status: Former Smoker    Quit date: 09/10/2009  . Smokeless tobacco: Never Used  . Alcohol Use: Yes     Comment: occasional   OB History   Grav Para Term Preterm Abortions TAB SAB Ect Mult Living   2 2 2       2      Review of Systems  Constitutional: Positive for fever.  Respiratory: Negative for cough and shortness of breath.   Gastrointestinal: Positive for nausea. Negative for vomiting and abdominal pain.  Genitourinary: Positive for dysuria and frequency. Negative for vaginal discharge.  Musculoskeletal: Positive for back pain.   All other systems reviewed and are negative.    Allergies  Review of patient's allergies indicates no known allergies.  Home Medications  No current outpatient prescriptions on file. BP 103/60  Pulse 117  Temp(Src) 102.9 F (39.4 C) (Oral)  Resp 15  Ht 5\' 3"  (1.6 m)  Wt 120 lb (54.432 kg)  BMI 21.26 kg/m2  SpO2 99%  LMP 01/25/2013  Breastfeeding? No Physical Exam  Nursing note and vitals reviewed. Constitutional: She is oriented to person, place, and time. She appears well-developed and well-nourished. No distress.  HENT:  Head: Normocephalic and atraumatic.  Right Ear: External ear normal.  Left Ear: External ear normal.  Nose: Nose normal.  Eyes: Right eye exhibits no discharge. Left eye exhibits no discharge.  Cardiovascular: Normal rate, regular rhythm and normal heart sounds.   Pulmonary/Chest: Effort normal and breath sounds normal.  Abdominal: Soft. She exhibits no distension. There is no tenderness. There is CVA tenderness (Mild).  Neurological: She is alert and oriented to person, place, and time.  Skin: Skin is warm and dry.    ED Course  Procedures (including critical care time) Labs Review Labs Reviewed  URINALYSIS, ROUTINE W REFLEX MICROSCOPIC - Abnormal; Notable for the following:    APPearance TURBID (*)    Hgb urine dipstick LARGE (*)    Ketones, ur 15 (*)    Protein, ur 100 (*)    Nitrite POSITIVE (*)    Leukocytes, UA LARGE (*)    All  other components within normal limits  CBC - Abnormal; Notable for the following:    WBC 16.0 (*)    All other components within normal limits  COMPREHENSIVE METABOLIC PANEL - Abnormal; Notable for the following:    Sodium 133 (*)    Glucose, Bld 116 (*)    Total Protein 8.8 (*)    Total Bilirubin 2.0 (*)    GFR calc non Af Amer 83 (*)    All other components within normal limits  URINE MICROSCOPIC-ADD ON - Abnormal; Notable for the following:    Squamous Epithelial / LPF FEW (*)    Bacteria, UA MANY (*)     All other components within normal limits  URINE CULTURE  POCT PREGNANCY, URINE   Imaging Review No results found.  EKG Interpretation   None       MDM   1. Pyelonephritis    Patient appears well here. She is able to tolerate oral fluids. She seems to have early pyelonephritis. However she is otherwise young and healthy, has been given her persistent antibiotics as IV, and so I feel she can be treated as an outpatient. We'll treat her with pyelonephritis antibiotics, sent her urine for culture, and she was given strict return precautions for which to return. We'll give antibiotics as well as she was having some nausea at home.    Audree Camel, MD 02/20/13 [phone]

## 2013-02-20 NOTE — ED Notes (Signed)
EDP made aware of pt's temperature, states OK for d/c

## 2013-02-20 NOTE — ED Notes (Signed)
Patient states she feels as if she has UTI, patient with frequent urination with burning, dark colored urine, lower abdominal pain

## 2013-02-22 LAB — URINE CULTURE

## 2013-05-15 ENCOUNTER — Emergency Department (HOSPITAL_COMMUNITY)
Admission: EM | Admit: 2013-05-15 | Discharge: 2013-05-15 | Disposition: A | Discharge status: Home / Self Care | Payer: No Typology Code available for payment source | Attending: Emergency Medicine | Admitting: Emergency Medicine

## 2013-05-15 ENCOUNTER — Encounter (HOSPITAL_COMMUNITY): Payer: Self-pay | Admitting: Emergency Medicine

## 2013-05-15 DIAGNOSIS — Y92410 Unspecified street and highway as the place of occurrence of the external cause: Secondary | ICD-10-CM | POA: Insufficient documentation

## 2013-05-15 DIAGNOSIS — Y9389 Activity, other specified: Secondary | ICD-10-CM | POA: Insufficient documentation

## 2013-05-15 DIAGNOSIS — M549 Dorsalgia, unspecified: Secondary | ICD-10-CM

## 2013-05-15 DIAGNOSIS — D573 Sickle-cell trait: Secondary | ICD-10-CM | POA: Insufficient documentation

## 2013-05-15 DIAGNOSIS — Z87891 Personal history of nicotine dependence: Secondary | ICD-10-CM | POA: Insufficient documentation

## 2013-05-15 MED ORDER — METHOCARBAMOL 500 MG PO TABS: 500.0000 mg | ORAL_TABLET | Freq: Two times a day (BID) | ORAL | Status: DC | PRN

## 2013-05-15 MED ORDER — IBUPROFEN 800 MG PO TABS: 800.0000 mg | ORAL_TABLET | Freq: Three times a day (TID) | ORAL | Status: DC
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
## 2013-05-15 NOTE — Discharge Instructions (Signed)
Take the prescribed medication as directed.  May apply heat to affected area to help with pain. Follow-up with [facility] if problems occur. Return to the ED for new or worsening symptoms.

## 2013-05-15 NOTE — ED Notes (Signed)
Cannot find pt x 2 called everywhere
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
## 2013-05-16 NOTE — ED Provider Notes (Signed)
Medical screening examination/treatment/procedure(s) were performed by non-physician practitioner and as supervising physician I was immediately available for consultation/collaboration.  EKG Interpretation   None         Kavontae Pritchard S Cathaleen Korol, MD 05/16/13 1027 

## 2014-02-26 ENCOUNTER — Encounter (HOSPITAL_COMMUNITY): Payer: Self-pay | Admitting: Emergency Medicine

## 2014-06-17 ENCOUNTER — Emergency Department (HOSPITAL_COMMUNITY)
Admission: EM | Admit: 2014-06-17 | Discharge: 2014-06-17 | Disposition: A | Discharge status: Home / Self Care | Payer: Medicaid Other | Attending: Emergency Medicine | Admitting: Emergency Medicine

## 2014-06-17 ENCOUNTER — Emergency Department (HOSPITAL_COMMUNITY): Payer: Medicaid Other

## 2014-06-17 ENCOUNTER — Encounter (HOSPITAL_COMMUNITY): Payer: Self-pay | Admitting: Nurse Practitioner

## 2014-06-17 DIAGNOSIS — R000 Tachycardia, unspecified: Secondary | ICD-10-CM | POA: Insufficient documentation

## 2014-06-17 DIAGNOSIS — O23591 Infection of other part of genital tract in pregnancy, first trimester: Secondary | ICD-10-CM | POA: Diagnosis not present

## 2014-06-17 DIAGNOSIS — R51 Headache: Secondary | ICD-10-CM | POA: Diagnosis not present

## 2014-06-17 DIAGNOSIS — Z862 Personal history of diseases of the blood and blood-forming organs and certain disorders involving the immune mechanism: Secondary | ICD-10-CM | POA: Diagnosis not present

## 2014-06-17 DIAGNOSIS — Z87891 Personal history of nicotine dependence: Secondary | ICD-10-CM | POA: Diagnosis not present

## 2014-06-17 DIAGNOSIS — Z3A01 Less than 8 weeks gestation of pregnancy: Secondary | ICD-10-CM | POA: Insufficient documentation

## 2014-06-17 DIAGNOSIS — O26899 Other specified pregnancy related conditions, unspecified trimester: Secondary | ICD-10-CM

## 2014-06-17 DIAGNOSIS — O2341 Unspecified infection of urinary tract in pregnancy, first trimester: Secondary | ICD-10-CM | POA: Diagnosis not present

## 2014-06-17 DIAGNOSIS — R509 Fever, unspecified: Secondary | ICD-10-CM

## 2014-06-17 DIAGNOSIS — R42 Dizziness and giddiness: Secondary | ICD-10-CM | POA: Diagnosis not present

## 2014-06-17 DIAGNOSIS — R197 Diarrhea, unspecified: Secondary | ICD-10-CM | POA: Insufficient documentation

## 2014-06-17 DIAGNOSIS — R52 Pain, unspecified: Secondary | ICD-10-CM

## 2014-06-17 DIAGNOSIS — Z791 Long term (current) use of non-steroidal anti-inflammatories (NSAID): Secondary | ICD-10-CM | POA: Insufficient documentation

## 2014-06-17 DIAGNOSIS — A5901 Trichomonal vulvovaginitis: Secondary | ICD-10-CM | POA: Diagnosis not present

## 2014-06-17 DIAGNOSIS — O9989 Other specified diseases and conditions complicating pregnancy, childbirth and the puerperium: Secondary | ICD-10-CM | POA: Diagnosis present

## 2014-06-17 DIAGNOSIS — B9689 Other specified bacterial agents as the cause of diseases classified elsewhere: Secondary | ICD-10-CM

## 2014-06-17 DIAGNOSIS — R109 Unspecified abdominal pain: Secondary | ICD-10-CM

## 2014-06-17 DIAGNOSIS — N760 Acute vaginitis: Secondary | ICD-10-CM

## 2014-06-17 LAB — CBC WITH DIFFERENTIAL/PLATELET
Basophils Absolute: 0 10*3/uL (ref 0.0–0.1)
Basophils Relative: 0 % (ref 0–1)
Eosinophils Absolute: 0 10*3/uL (ref 0.0–0.7)
Eosinophils Relative: 0 % (ref 0–5)
HEMATOCRIT: 37.3 % (ref 36.0–46.0)
HEMOGLOBIN: 12.8 g/dL (ref 12.0–15.0)
LYMPHS ABS: 0.5 10*3/uL — AB (ref 0.7–4.0)
Lymphocytes Relative: 6 % — ABNORMAL LOW (ref 12–46)
MCH: 30.5 pg (ref 26.0–34.0)
MCHC: 34.3 g/dL (ref 30.0–36.0)
MCV: 89 fL (ref 78.0–100.0)
MONO ABS: 0.6 10*3/uL (ref 0.1–1.0)
Monocytes Relative: 7 % (ref 3–12)
Neutro Abs: 6.9 10*3/uL (ref 1.7–7.7)
Neutrophils Relative %: 87 % — ABNORMAL HIGH (ref 43–77)
Platelets: 206 10*3/uL (ref 150–400)
RBC: 4.19 MIL/uL (ref 3.87–5.11)
RDW: 12.3 % (ref 11.5–15.5)
WBC: 7.9 10*3/uL (ref 4.0–10.5)

## 2014-06-17 LAB — COMPREHENSIVE METABOLIC PANEL
ALT: 15 U/L (ref 0–35)
AST: 22 U/L (ref 0–37)
Albumin: 4 g/dL (ref 3.5–5.2)
Alkaline Phosphatase: 60 U/L (ref 39–117)
Anion gap: 7 (ref 5–15)
BUN: 5 mg/dL — AB (ref 6–23)
CO2: 21 mmol/L (ref 19–32)
Calcium: 8.9 mg/dL (ref 8.4–10.5)
Chloride: 104 mmol/L (ref 96–112)
Creatinine, Ser: 0.69 mg/dL (ref 0.50–1.10)
GLUCOSE: 103 mg/dL — AB (ref 70–99)
POTASSIUM: 3.9 mmol/L (ref 3.5–5.1)
Sodium: 132 mmol/L — ABNORMAL LOW (ref 135–145)
TOTAL PROTEIN: 7.6 g/dL (ref 6.0–8.3)
Total Bilirubin: 0.9 mg/dL (ref 0.3–1.2)

## 2014-06-17 LAB — URINALYSIS, ROUTINE W REFLEX MICROSCOPIC
Bilirubin Urine: NEGATIVE
GLUCOSE, UA: NEGATIVE mg/dL
Hgb urine dipstick: NEGATIVE
Ketones, ur: NEGATIVE mg/dL
Nitrite: POSITIVE — AB
PROTEIN: NEGATIVE mg/dL
SPECIFIC GRAVITY, URINE: 1.009 (ref 1.005–1.030)
UROBILINOGEN UA: 1 mg/dL (ref 0.0–1.0)
pH: 6.5 (ref 5.0–8.0)

## 2014-06-17 LAB — WET PREP, GENITAL: Yeast Wet Prep HPF POC: NONE SEEN

## 2014-06-17 LAB — URINE MICROSCOPIC-ADD ON

## 2014-06-17 LAB — I-STAT CG4 LACTIC ACID, ED
LACTIC ACID, VENOUS: 0.88 mmol/L (ref 0.5–2.0)
Lactic Acid, Venous: 1.23 mmol/L (ref 0.5–2.0)

## 2014-06-17 LAB — POC URINE PREG, ED: Preg Test, Ur: POSITIVE — AB

## 2014-06-17 LAB — HCG, QUANTITATIVE, PREGNANCY: HCG, BETA CHAIN, QUANT, S: 2521 m[IU]/mL — AB (ref <5)

## 2014-06-17 MED ORDER — SODIUM CHLORIDE 0.9 % IV BOLUS (SEPSIS)
1000.0000 mL | Freq: Once | INTRAVENOUS | Status: AC
  Administered 2014-06-17: 1000 mL via INTRAVENOUS

## 2014-06-17 MED ORDER — CEFTRIAXONE SODIUM 1 G IJ SOLR
1.0000 g | Freq: Once | INTRAMUSCULAR | Status: AC
  Administered 2014-06-17: 1 g via INTRAVENOUS
  Filled 2014-06-17: qty 10

## 2014-06-17 MED ORDER — ACETAMINOPHEN 500 MG PO TABS
1000.0000 mg | ORAL_TABLET | Freq: Once | ORAL | Status: AC
  Administered 2014-06-17: 1000 mg via ORAL
  Filled 2014-06-17: qty 2

## 2014-06-17 MED ORDER — PRENATAL COMPLETE 14-0.4 MG PO TABS: 14.0000 mg | ORAL_TABLET | Freq: Every day | ORAL | Status: DC

## 2014-06-17 MED ORDER — CEPHALEXIN 500 MG PO CAPS: 500.0000 mg | ORAL_CAPSULE | Freq: Four times a day (QID) | ORAL | Status: DC

## 2014-06-17 MED ORDER — METRONIDAZOLE 500 MG PO TABS: 500.0000 mg | ORAL_TABLET | Freq: Two times a day (BID) | ORAL | Status: DC

## 2014-06-17 MED ORDER — AZITHROMYCIN 250 MG PO TABS
1000.0000 mg | ORAL_TABLET | Freq: Once | ORAL | Status: AC
  Administered 2014-06-17: 1000 mg via ORAL
  Filled 2014-06-17: qty 4

## 2014-06-17 MED ORDER — ONDANSETRON HCL 4 MG/2ML IJ SOLN
4.0000 mg | Freq: Once | INTRAMUSCULAR | Status: AC
  Administered 2014-06-17: 4 mg via INTRAVENOUS
  Filled 2014-06-17: qty 2

## 2014-06-17 NOTE — ED Notes (Signed)
US at bedside

## 2014-06-17 NOTE — ED Notes (Signed)
Pt presents with c/o flu-like symptoms and malaise, states onset was this a.m, arrival vital signs reveal tachycardia and fever, rates the body aches at 9/10. Denies shortness of breath, chest pain, diarrhea or emesis.

## 2014-06-17 NOTE — ED Notes (Signed)
MD Harrison at bedside. 

## 2014-06-17 NOTE — ED Provider Notes (Signed)
CSN: [id number]     Arrival date & time 06/17/14  1539 History   First MD Initiated Contact with Patient 06/17/14 1544     Chief Complaint  Patient presents with  . Fever  . Generalized Body Aches  . Nausea     (Consider location/radiation/quality/duration/timing/severity/associated sxs/prior Treatment) HPI Comments: 24 year old female presenting with fever, chills, fatigue, body aches 1 day. Patient reports she woke up not feeling well, checked her temperature and it was 101.3. She did not take any medication to try to reduce the fever. Reports over the past few days she's been experiencing low back pain over the past few days, worsening today. Pain is just generalized across her lower back. Denies neck pain. Denies chest pain, shortness of breath, diarrhea or vomiting, however has felt nauseated. had one episode of non-bloody diarrhea this morning. Denies any urinary symptoms or vaginal discharge. Last menstrual period was January 16, she took a pregnancy test this morning and it was positive. No sick contacts. She has young children that attend daycare.  Patient is a 24 y.o. female presenting with fever. The history is provided by the patient.  Fever Associated symptoms: chills, diarrhea, headaches, myalgias and nausea   Associated symptoms: no cough, no rash and no vomiting     Past Medical History  Diagnosis Date  . Sickle cell trait   . Abnormal Pap smear    Past Surgical History  Procedure Laterality Date  . Colposcopy     Family History  Problem Relation Age of Onset  . Diabetes Maternal Aunt   . Diabetes Maternal Grandfather    History  Substance Use Topics  . Smoking status: Former Smoker    Quit date: 09/10/2009  . Smokeless tobacco: Never Used  . Alcohol Use: Yes     Comment: occasional   OB History    Gravida Para Term Preterm AB TAB SAB Ectopic Multiple Living   2 2 2       2      Review of Systems  Constitutional: Positive for fever and chills.   Respiratory: Negative for cough and shortness of breath.   Gastrointestinal: Positive for nausea and diarrhea. Negative for vomiting and abdominal pain.  Musculoskeletal: Positive for myalgias, back pain and arthralgias. Negative for neck pain.  Skin: Negative for rash.  Neurological: Positive for dizziness and headaches.  All other systems reviewed and are negative.     Allergies  Review of patient's allergies indicates no known allergies.  Home Medications   Prior to Admission medications   Medication Sig Start Date End Date Taking? Authorizing Provider  cephALEXin (KEFLEX) 500 MG capsule Take 1 capsule (500 mg total) by mouth 4 (four) times daily. 06/17/14   Carman Ching, PA-C  ibuprofen (ADVIL,MOTRIN) 800 MG tablet Take 1 tablet (800 mg total) by mouth 3 (three) times daily. Patient not taking: Reported on 06/17/2014 05/15/13   Larene Pickett, PA-C  methocarbamol (ROBAXIN) 500 MG tablet Take 1 tablet (500 mg total) by mouth 2 (two) times daily as needed. 05/15/13   Larene Pickett, PA-C  metroNIDAZOLE (FLAGYL) 500 MG tablet Take 1 tablet (500 mg total) by mouth 2 (two) times daily. One po bid x 7 days 06/17/14   Carman Ching, PA-C  Prenatal Vit-Fe Fumarate-FA (PRENATAL COMPLETE) 14-0.4 MG TABS Take 14 mg by mouth daily. 06/17/14   Carman Ching, PA-C   BP 99/76 mmHg  Pulse 101  Temp(Src) 99.8 F (37.7 C) (Oral)  Resp 20  Ht  5\' 3"  (1.6 m)  Wt 120 lb (54.432 kg)  BMI 21.26 kg/m2  SpO2 100%  LMP 05/12/2014 Physical Exam  Constitutional: She is oriented to person, place, and time. She appears well-developed and well-nourished. No distress.  HENT:  Head: Normocephalic and atraumatic.  Mouth/Throat: Oropharynx is clear and moist.  Eyes: Conjunctivae and EOM are normal. Pupils are equal, round, and reactive to light.  Neck: Normal range of motion. Neck supple. No Brudzinski's sign and no Kernig's sign noted.  Cardiovascular: Regular rhythm, normal heart sounds and intact distal  pulses.   Tachycardic.  Pulmonary/Chest: Effort normal and breath sounds normal. No respiratory distress.  Abdominal: Soft. Normal appearance and bowel sounds are normal. She exhibits no distension. There is no tenderness. There is no CVA tenderness.  Genitourinary: Uterus is tender ("pressure" sensation). Cervix exhibits no motion tenderness, no discharge and no friability. Right adnexum displays no mass, no tenderness and no fullness. Left adnexum displays no mass, no tenderness and no fullness. No erythema or bleeding in the vagina. No foreign body around the vagina. Vaginal discharge (scant, white) found.  Musculoskeletal: Normal range of motion. She exhibits no edema.       Cervical back: She exhibits normal range of motion, no tenderness and no bony tenderness.       Thoracic back: She exhibits normal range of motion, no tenderness and no bony tenderness.       Lumbar back: She exhibits normal range of motion, no tenderness and no bony tenderness.  Neurological: She is alert and oriented to person, place, and time. She has normal strength. No cranial nerve deficit or sensory deficit. Coordination and gait normal.  Skin: Skin is warm and dry. No rash noted. She is not diaphoretic.  Psychiatric: She has a normal mood and affect. Her behavior is normal.  Nursing note and vitals reviewed.   ED Course  Procedures (including critical care time) Labs Review Labs Reviewed  WET PREP, GENITAL - Abnormal; Notable for the following:    Trich, Wet Prep RARE (*)    Clue Cells Wet Prep HPF POC FEW (*)    WBC, Wet Prep HPF POC MANY (*)    All other components within normal limits  CBC WITH DIFFERENTIAL/PLATELET - Abnormal; Notable for the following:    Neutrophils Relative % 87 (*)    Lymphocytes Relative 6 (*)    Lymphs Abs 0.5 (*)    All other components within normal limits  COMPREHENSIVE METABOLIC PANEL - Abnormal; Notable for the following:    Sodium 132 (*)    Glucose, Bld 103 (*)    BUN  5 (*)    All other components within normal limits  URINALYSIS, ROUTINE W REFLEX MICROSCOPIC - Abnormal; Notable for the following:    APPearance CLOUDY (*)    Nitrite POSITIVE (*)    Leukocytes, UA MODERATE (*)    All other components within normal limits  HCG, QUANTITATIVE, PREGNANCY - Abnormal; Notable for the following:    hCG, Beta Chain, Quant, S 2521 (*)    All other components within normal limits  URINE MICROSCOPIC-ADD ON - Abnormal; Notable for the following:    Squamous Epithelial / LPF FEW (*)    Bacteria, UA FEW (*)    All other components within normal limits  POC URINE PREG, ED - Abnormal; Notable for the following:    Preg Test, Ur POSITIVE (*)    All other components within normal limits  I-STAT CG4 LACTIC ACID, ED  I-STAT  CG4 LACTIC ACID, ED  GC/CHLAMYDIA PROBE AMP ([place])    Imaging Review US Ob Comp Less 14 Wks  06/17/2014   CLINICAL DATA:  Diffuse myalgias. Fever. Urinary tract infection. Lower pelvic pain today. I have weeks and 1 day pregnant by last menstrual period. Quantitative beta HCG 2,521.  EXAM: OBSTETRIC <14 WK [place] AND TRANSVAGINAL OB US  TECHNIQUE: Both transabdominal and transvaginal ultrasound examinations were performed for complete evaluation of the gestation as well as the maternal uterus, adnexal regions, and pelvic cul-de-sac. Transvaginal technique was performed to assess early pregnancy.  COMPARISON:  07/14/2011.  FINDINGS: Intrauterine gestational sac: Visualized/normal in shape.  Yolk sac:  Not visualized  Embryo:  Not visualized  MSD: 6  mm   5 w   2  d             [place] EDC: 02/15/2015  Maternal uterus/adnexae: Left ovarian corpus luteum. Unremarkable right ovary. No subchorionic hemorrhage. Trace amount of free peritoneal fluid.  IMPRESSION: Probable early intrauterine gestational sac with an estimated gestational age of [redacted] weeks and 2 days, but no yolk sac, fetal pole, or cardiac activity yet visualized. Recommend follow-up quantitative B-HCG  levels and follow-up US in 14 days to confirm and assess viability. This recommendation follows SRU consensus guidelines: Diagnostic Criteria for Nonviable Pregnancy Early in the First Trimester. [place] Med 2013; 144:8185-63.   Electronically Signed   By: Claudie Revering M.D.   On: 06/17/2014 18:15   US Ob Transvaginal  06/17/2014   CLINICAL DATA:  Diffuse myalgias. Fever. Urinary tract infection. Lower pelvic pain today. I have weeks and 1 day pregnant by last menstrual period. Quantitative beta HCG 2,521.  EXAM: OBSTETRIC <14 WK [place] AND TRANSVAGINAL OB US  TECHNIQUE: Both transabdominal and transvaginal ultrasound examinations were performed for complete evaluation of the gestation as well as the maternal uterus, adnexal regions, and pelvic cul-de-sac. Transvaginal technique was performed to assess early pregnancy.  COMPARISON:  07/14/2011.  FINDINGS: Intrauterine gestational sac: Visualized/normal in shape.  Yolk sac:  Not visualized  Embryo:  Not visualized  MSD: 6  mm   5 w   2  d             [place] EDC: 02/15/2015  Maternal uterus/adnexae: Left ovarian corpus luteum. Unremarkable right ovary. No subchorionic hemorrhage. Trace amount of free peritoneal fluid.  IMPRESSION: Probable early intrauterine gestational sac with an estimated gestational age of [redacted] weeks and 2 days, but no yolk sac, fetal pole, or cardiac activity yet visualized. Recommend follow-up quantitative B-HCG levels and follow-up US in 14 days to confirm and assess viability. This recommendation follows SRU consensus guidelines: Diagnostic Criteria for Nonviable Pregnancy Early in the First Trimester. [employer] Med 2013; 149:7026-37.   Electronically Signed   By: Claudie Revering M.D.   On: 06/17/2014 18:15     EKG Interpretation None      MDM   Final diagnoses:  UTI in pregnancy, first trimester  Trichomonas vaginitis  BV (bacterial vaginosis)   Nontoxic appearing and in no apparent distress. Febrile and tachycardic on arrival, no  antipyretics prior to arrival. Abdomen soft nontender. Cannot reproduce back pain. Headache is generalized, common associated neck pain, no focal neurologic deficits. Doubt meningitis, SAH, ICH or CVA. No CMT or adnexal tenderness. Urine pregnancy positive, urine positive for infection. Patient given IV fluids, Tylenol and IV Rocephin in the ED. Wet prep positive for trichomoniasis and clue cells. Will treat with Flagyl. She was  also given azithromycin in the emergency department. Patient expressed at this pregnancy will be terminated, however I do feel she should be discharged with prenatal vitamins any event that she changes her mind. Ultrasound showing probable early intrauterine gestational sac, however no yolk sac, fetal pole or cardiac activity visualized. I advised her to follow-up at [facility] outpatient clinic for further evaluation, and repeat ultrasound. After IV fluids, heart rate significantly improved in temperature down to 99.8. Discharge home with Keflex, Flagyl and prenatal vitamins. Stable for discharge. Return precautions given. Patient states understanding of treatment care plan and is agreeable.  Discussed with attending Dr. Aline Brochure who agrees with plan of care.    Carman Ching, PA-C 06/17/14 1842  Pamella Pert, MD 06/17/14 [phone]

## 2014-06-17 NOTE — Discharge Instructions (Signed)
Take prenatal vitamins daily. Follow-up with women's outpatient clinic, you will need another ultrasound in 14 days to evaluate for heartbeat. Take Keflex as directed for [redacted] week along with Flagyl for 1 week. You were treated today for both gonorrhea and chlamydia. If these tests result positive, you will be contacted and are then obligated to inform your partner for treatment.  Urinary Tract Infection Urinary tract infections (UTIs) can develop anywhere along your urinary tract. Your urinary tract is your body's drainage system for removing wastes and extra water. Your urinary tract includes two kidneys, two ureters, a bladder, and a urethra. Your kidneys are a pair of bean-shaped organs. Each kidney is about the size of your fist. They are located below your ribs, one on each side of your spine. CAUSES Infections are caused by microbes, which are microscopic organisms, including fungi, viruses, and bacteria. These organisms are so small that they can only be seen through a microscope. Bacteria are the microbes that most commonly cause UTIs. SYMPTOMS  Symptoms of UTIs may vary by age and gender of the patient and by the location of the infection. Symptoms in young women typically include a frequent and intense urge to urinate and a painful, burning feeling in the bladder or urethra during urination. Older women and men are more likely to be tired, shaky, and weak and have muscle aches and abdominal pain. A fever may mean the infection is in your kidneys. Other symptoms of a kidney infection include pain in your back or sides below the ribs, nausea, and vomiting. DIAGNOSIS To diagnose a UTI, your caregiver will ask you about your symptoms. Your caregiver also will ask to provide a urine sample. The urine sample will be tested for bacteria and white blood cells. White blood cells are made by your body to help fight infection. TREATMENT  Typically, UTIs can be treated with medication. Because most UTIs are  caused by a bacterial infection, they usually can be treated with the use of antibiotics. The choice of antibiotic and length of treatment depend on your symptoms and the type of bacteria causing your infection. HOME CARE INSTRUCTIONS  If you were prescribed antibiotics, take them exactly as your caregiver instructs you. Finish the medication even if you feel better after you have only taken some of the medication.  Drink enough water and fluids to keep your urine clear or pale yellow.  Avoid caffeine, tea, and carbonated beverages. They tend to irritate your bladder.  Empty your bladder often. Avoid holding urine for long periods of time.  Empty your bladder before and after sexual intercourse.  After a bowel movement, women should cleanse from front to back. Use each tissue only once. SEEK MEDICAL CARE IF:   You have back pain.  You develop a fever.  Your symptoms do not begin to resolve within 3 days. SEEK IMMEDIATE MEDICAL CARE IF:   You have severe back pain or lower abdominal pain.  You develop chills.  You have nausea or vomiting.  You have continued burning or discomfort with urination. MAKE SURE YOU:   Understand these instructions.  Will watch your condition.  Will get help right away if you are not doing well or get worse. Document Released: 01/21/2005 Document Revised: 10/13/2011 Document Reviewed: 05/22/2011 [facility] Patient Information 2015 [place], [place]. This information is not intended to replace advice given to you by your health care provider. Make sure you discuss any questions you have with your health care provider.  Trichomoniasis Trichomoniasis is  an infection caused by an organism called Trichomonas. The infection can affect both women and men. In women, the outer female genitalia and the vagina are affected. In men, the penis is mainly affected, but the prostate and other reproductive organs can also be involved. Trichomoniasis is a sexually  transmitted infection (STI) and is most often passed to another person through sexual contact.  RISK FACTORS  Having unprotected sexual intercourse.  Having sexual intercourse with an infected partner. SIGNS AND SYMPTOMS  Symptoms of trichomoniasis in women include:  Abnormal gray-green frothy vaginal discharge.  Itching and irritation of the vagina.  Itching and irritation of the area outside the vagina. Symptoms of trichomoniasis in men include:   Penile discharge with or without pain.  Pain during urination. This results from inflammation of the urethra. DIAGNOSIS  Trichomoniasis may be found during a Pap test or physical exam. Your health care provider may use one of the following methods to help diagnose this infection:  Examining vaginal discharge under a microscope. For men, urethral discharge would be examined.  Testing the pH of the vagina with a test tape.  Using a vaginal swab test that checks for the Trichomonas organism. A test is available that provides results within a few minutes.  Doing a culture test for the organism. This is not usually needed. TREATMENT   You may be given medicine to fight the infection. Women should inform their health care provider if they could be or are pregnant. Some medicines used to treat the infection should not be taken during pregnancy.  Your health care provider may recommend over-the-counter medicines or creams to decrease itching or irritation.  Your sexual partner will need to be treated if infected. HOME CARE INSTRUCTIONS   Take medicines only as directed by your health care provider.  Take over-the-counter medicine for itching or irritation as directed by your health care provider.  Do not have sexual intercourse while you have the infection.  Women should not douche or wear tampons while they have the infection.  Discuss your infection with your partner. Your partner may have gotten the infection from you, or you may  have gotten it from your partner.  Have your sex partner get examined and treated if necessary.  Practice safe, informed, and protected sex.  See your health care provider for other STI testing. SEEK MEDICAL CARE IF:   You still have symptoms after you finish your medicine.  You develop abdominal pain.  You have pain when you urinate.  You have bleeding after sexual intercourse.  You develop a rash.  Your medicine makes you sick or makes you throw up (vomit). MAKE SURE YOU:  Understand these instructions.  Will watch your condition.  Will get help right away if you are not doing well or get worse. Document Released: 10/07/2000 Document Revised: 08/28/2013 Document Reviewed: 01/23/2013 [facility] Patient Information 2015 [place], [place]. This information is not intended to replace advice given to you by your health care provider. Make sure you discuss any questions you have with your health care provider. First Trimester of Pregnancy The first trimester of pregnancy is from week 1 until the end of week 12 (months 1 through 3). A week after a sperm fertilizes an egg, the egg will implant on the wall of the uterus. This embryo will begin to develop into a baby. Genes from you and your partner are forming the baby. The female genes determine whether the baby is a boy or a girl. At 6-8 weeks,  the eyes and face are formed, and the heartbeat can be seen on ultrasound. At the end of 12 weeks, all the baby's organs are formed.  Now that you are pregnant, you will want to do everything you can to have a healthy baby. Two of the most important things are to get good prenatal care and to follow your health care provider's instructions. Prenatal care is all the medical care you receive before the baby's birth. This care will help prevent, find, and treat any problems during the pregnancy and childbirth. BODY CHANGES Your body goes through many changes during pregnancy. The changes vary from woman to  woman.   You may gain or lose a couple of pounds at first.  You may feel sick to your stomach (nauseous) and throw up (vomit). If the vomiting is uncontrollable, call your health care provider.  You may tire easily.  You may develop headaches that can be relieved by medicines approved by your health care provider.  You may urinate more often. Painful urination may mean you have a bladder infection.  You may develop heartburn as a result of your pregnancy.  You may develop constipation because certain hormones are causing the muscles that push waste through your intestines to slow down.  You may develop hemorrhoids or swollen, bulging veins (varicose veins).  Your breasts may begin to grow larger and become tender. Your nipples may stick out more, and the tissue that surrounds them (areola) may become darker.  Your gums may bleed and may be sensitive to brushing and flossing.  Dark spots or blotches (chloasma, mask of pregnancy) may develop on your face. This will likely fade after the baby is born.  Your menstrual periods will stop.  You may have a loss of appetite.  You may develop cravings for certain kinds of food.  You may have changes in your emotions from day to day, such as being excited to be pregnant or being concerned that something may go wrong with the pregnancy and baby.  You may have more vivid and strange dreams.  You may have changes in your hair. These can include thickening of your hair, rapid growth, and changes in texture. Some women also have hair loss during or after pregnancy, or hair that feels dry or thin. Your hair will most likely return to normal after your baby is born. WHAT TO EXPECT AT YOUR PRENATAL VISITS During a routine prenatal visit:  You will be weighed to make sure you and the baby are growing normally.  Your blood pressure will be taken.  Your abdomen will be measured to track your baby's growth.  The fetal heartbeat will be listened  to starting around week 10 or 12 of your pregnancy.  Test results from any previous visits will be discussed. Your health care provider may ask you:  How you are feeling.  If you are feeling the baby move.  If you have had any abnormal symptoms, such as leaking fluid, bleeding, severe headaches, or abdominal cramping.  If you have any questions. Other tests that may be performed during your first trimester include:  Blood tests to find your blood type and to check for the presence of any previous infections. They will also be used to check for low iron levels (anemia) and Rh antibodies. Later in the pregnancy, blood tests for diabetes will be done along with other tests if problems develop.  Urine tests to check for infections, diabetes, or protein in the urine.  An  ultrasound to confirm the proper growth and development of the baby.  An amniocentesis to check for possible genetic problems.  Fetal screens for spina bifida and Down syndrome.  You may need other tests to make sure you and the baby are doing well. HOME CARE INSTRUCTIONS  Medicines  Follow your health care provider's instructions regarding medicine use. Specific medicines may be either safe or unsafe to take during pregnancy.  Take your prenatal vitamins as directed.  If you develop constipation, try taking a stool softener if your health care provider approves. Diet  Eat regular, well-balanced meals. Choose a variety of foods, such as meat or vegetable-based protein, fish, milk and low-fat dairy products, vegetables, fruits, and whole grain breads and cereals. Your health care provider will help you determine the amount of weight gain that is right for you.  Avoid raw meat and uncooked cheese. These carry germs that can cause birth defects in the baby.  Eating four or five small meals rather than three large meals a day may help relieve nausea and vomiting. If you start to feel nauseous, eating a few soda crackers  can be helpful. Drinking liquids between meals instead of during meals also seems to help nausea and vomiting.  If you develop constipation, eat more high-fiber foods, such as fresh vegetables or fruit and whole grains. Drink enough fluids to keep your urine clear or pale yellow. Activity and Exercise  Exercise only as directed by your health care provider. Exercising will help you:  Control your weight.  Stay in shape.  Be prepared for labor and delivery.  Experiencing pain or cramping in the lower abdomen or low back is a good sign that you should stop exercising. Check with your health care provider before continuing normal exercises.  Try to avoid standing for long periods of time. Move your legs often if you must stand in one place for a long time.  Avoid heavy lifting.  Wear low-heeled shoes, and practice good posture.  You may continue to have sex unless your health care provider directs you otherwise. Relief of Pain or Discomfort  Wear a good support bra for breast tenderness.   Take warm sitz baths to soothe any pain or discomfort caused by hemorrhoids. Use hemorrhoid cream if your health care provider approves.   Rest with your legs elevated if you have leg cramps or low back pain.  If you develop varicose veins in your legs, wear support hose. Elevate your feet for 15 minutes, 3-4 times a day. Limit salt in your diet. Prenatal Care  Schedule your prenatal visits by the twelfth week of pregnancy. They are usually scheduled monthly at first, then more often in the last 2 months before delivery.  Write down your questions. Take them to your prenatal visits.  Keep all your prenatal visits as directed by your health care provider. Safety  Wear your seat belt at all times when driving.  Make a list of emergency phone numbers, including numbers for family, friends, the hospital, and police and fire departments. General Tips  Ask your health care provider for a  referral to a local prenatal education class. Begin classes no later than at the beginning of month 6 of your pregnancy.  Ask for help if you have counseling or nutritional needs during pregnancy. Your health care provider can offer advice or refer you to specialists for help with various needs.  Do not use hot tubs, steam rooms, or saunas.  Do not douche or use  tampons or scented sanitary pads.  Do not cross your legs for long periods of time.  Avoid cat litter boxes and soil used by cats. These carry germs that can cause birth defects in the baby and possibly loss of the fetus by miscarriage or stillbirth.  Avoid all smoking, herbs, alcohol, and medicines not prescribed by your health care provider. Chemicals in these affect the formation and growth of the baby.  Schedule a dentist appointment. At home, brush your teeth with a soft toothbrush and be gentle when you floss. SEEK MEDICAL CARE IF:   You have dizziness.  You have mild pelvic cramps, pelvic pressure, or nagging pain in the abdominal area.  You have persistent nausea, vomiting, or diarrhea.  You have a bad smelling vaginal discharge.  You have pain with urination.  You notice increased swelling in your face, hands, legs, or ankles. SEEK IMMEDIATE MEDICAL CARE IF:   You have a fever.  You are leaking fluid from your vagina.  You have spotting or bleeding from your vagina.  You have severe abdominal cramping or pain.  You have rapid weight gain or loss.  You vomit blood or material that looks like coffee grounds.  You are exposed to [place] measles and have never had them.  You are exposed to fifth disease or chickenpox.  You develop a severe headache.  You have shortness of breath.  You have any kind of trauma, such as from a fall or a car accident. Document Released: 04/07/2001 Document Revised: 08/28/2013 Document Reviewed: 02/21/2013 [facility] Patient Information 2015 [place], [place]. This information  is not intended to replace advice given to you by your health care provider. Make sure you discuss any questions you have with your health care provider. Bacterial Vaginosis Bacterial vaginosis is a vaginal infection that occurs when the normal balance of bacteria in the vagina is disrupted. It results from an overgrowth of certain bacteria. This is the most common vaginal infection in women of childbearing age. Treatment is important to prevent complications, especially in pregnant women, as it can cause a premature delivery. CAUSES  Bacterial vaginosis is caused by an increase in harmful bacteria that are normally present in smaller amounts in the vagina. Several different kinds of bacteria can cause bacterial vaginosis. However, the reason that the condition develops is not fully understood. RISK FACTORS Certain activities or behaviors can put you at an increased risk of developing bacterial vaginosis, including:  Having a new sex partner or multiple sex partners.  Douching.  Using an intrauterine device (IUD) for contraception. Women do not get bacterial vaginosis from toilet seats, bedding, swimming pools, or contact with objects around them. SIGNS AND SYMPTOMS  Some women with bacterial vaginosis have no signs or symptoms. Common symptoms include:  Grey vaginal discharge.  A fishlike odor with discharge, especially after sexual intercourse.  Itching or burning of the vagina and vulva.  Burning or pain with urination. DIAGNOSIS  Your health care provider will take a medical history and examine the vagina for signs of bacterial vaginosis. A sample of vaginal fluid may be taken. Your health care provider will look at this sample under a microscope to check for bacteria and abnormal cells. A vaginal pH test may also be done.  TREATMENT  Bacterial vaginosis may be treated with antibiotic medicines. These may be given in the form of a pill or a vaginal cream. A second round of antibiotics  may be prescribed if the condition comes back after treatment.  HOME  CARE INSTRUCTIONS   Only take over-the-counter or prescription medicines as directed by your health care provider.  If antibiotic medicine was prescribed, take it as directed. Make sure you finish it even if you start to feel better.  Do not have sex until treatment is completed.  Tell all sexual partners that you have a vaginal infection. They should see their health care provider and be treated if they have problems, such as a mild rash or itching.  Practice safe sex by using condoms and only having one sex partner. SEEK MEDICAL CARE IF:   Your symptoms are not improving after 3 days of treatment.  You have increased discharge or pain.  You have a fever. MAKE SURE YOU:   Understand these instructions.  Will watch your condition.  Will get help right away if you are not doing well or get worse. FOR MORE INFORMATION  Centers for Disease Control and Prevention, Division of STD Prevention: [employer] American Sexual Health Association (ASHA): www.ashastd.org  Document Released: 04/13/2005 Document Revised: 02/01/2013 Document Reviewed: 11/23/2012 [facility] Patient Information 2015 [place], [place]. This information is not intended to replace advice given to you by your health care provider. Make sure you discuss any questions you have with your health care provider.

## 2014-06-17 NOTE — ED Notes (Signed)
Received in report that pt was waiting on lactic acid to results that was drawn at [place]. Informed Robyn, PA that pt was waiting on results prior to d/c and she stated that the lactic acid had resulted at 1600 and it was ok for d/c.

## 2014-06-18 LAB — GC/CHLAMYDIA PROBE AMP (~~LOC~~) NOT AT ARMC
Chlamydia: NEGATIVE
Neisseria Gonorrhea: NEGATIVE

## 2015-01-10 ENCOUNTER — Emergency Department (HOSPITAL_COMMUNITY): Payer: Medicaid Other

## 2015-01-10 ENCOUNTER — Emergency Department (HOSPITAL_COMMUNITY)
Admission: EM | Admit: 2015-01-10 | Discharge: 2015-01-10 | Disposition: A | Discharge status: Home / Self Care | Payer: Medicaid Other | Attending: Emergency Medicine | Admitting: Emergency Medicine

## 2015-01-10 ENCOUNTER — Encounter (HOSPITAL_COMMUNITY): Payer: Self-pay

## 2015-01-10 DIAGNOSIS — O9989 Other specified diseases and conditions complicating pregnancy, childbirth and the puerperium: Secondary | ICD-10-CM | POA: Diagnosis present

## 2015-01-10 DIAGNOSIS — R52 Pain, unspecified: Secondary | ICD-10-CM

## 2015-01-10 DIAGNOSIS — Z3A15 15 weeks gestation of pregnancy: Secondary | ICD-10-CM | POA: Insufficient documentation

## 2015-01-10 DIAGNOSIS — Z8619 Personal history of other infectious and parasitic diseases: Secondary | ICD-10-CM | POA: Diagnosis not present

## 2015-01-10 DIAGNOSIS — N898 Other specified noninflammatory disorders of vagina: Secondary | ICD-10-CM | POA: Insufficient documentation

## 2015-01-10 DIAGNOSIS — Z862 Personal history of diseases of the blood and blood-forming organs and certain disorders involving the immune mechanism: Secondary | ICD-10-CM | POA: Insufficient documentation

## 2015-01-10 DIAGNOSIS — Z87891 Personal history of nicotine dependence: Secondary | ICD-10-CM | POA: Diagnosis not present

## 2015-01-10 DIAGNOSIS — Z792 Long term (current) use of antibiotics: Secondary | ICD-10-CM | POA: Diagnosis not present

## 2015-01-10 DIAGNOSIS — Z3490 Encounter for supervision of normal pregnancy, unspecified, unspecified trimester: Secondary | ICD-10-CM

## 2015-01-10 DIAGNOSIS — O2392 Unspecified genitourinary tract infection in pregnancy, second trimester: Secondary | ICD-10-CM | POA: Insufficient documentation

## 2015-01-10 DIAGNOSIS — N390 Urinary tract infection, site not specified: Secondary | ICD-10-CM

## 2015-01-10 LAB — I-STAT BETA HCG BLOOD, ED (MC, WL, AP ONLY)

## 2015-01-10 LAB — URINALYSIS, ROUTINE W REFLEX MICROSCOPIC
Bilirubin Urine: NEGATIVE
Glucose, UA: NEGATIVE mg/dL
Hgb urine dipstick: NEGATIVE
KETONES UR: NEGATIVE mg/dL
NITRITE: NEGATIVE
Protein, ur: NEGATIVE mg/dL
Specific Gravity, Urine: 1.009 (ref 1.005–1.030)
UROBILINOGEN UA: 1 mg/dL (ref 0.0–1.0)
pH: 8 (ref 5.0–8.0)

## 2015-01-10 LAB — WET PREP, GENITAL
Clue Cells Wet Prep HPF POC: NONE SEEN
TRICH WET PREP: NONE SEEN

## 2015-01-10 LAB — POC URINE PREG, ED: PREG TEST UR: POSITIVE — AB

## 2015-01-10 LAB — URINE MICROSCOPIC-ADD ON

## 2015-01-10 MED ORDER — CEPHALEXIN 250 MG PO CAPS
500.0000 mg | ORAL_CAPSULE | Freq: Once | ORAL | Status: AC
  Administered 2015-01-10: 500 mg via ORAL
  Filled 2015-01-10: qty 2

## 2015-01-10 MED ORDER — CEPHALEXIN 500 MG PO CAPS: 500.0000 mg | ORAL_CAPSULE | Freq: Three times a day (TID) | ORAL | Status: DC

## 2015-01-10 NOTE — ED Notes (Signed)
[facility] returned from [facility]

## 2015-01-10 NOTE — ED Notes (Signed)
Pt transported to [facility]

## 2015-01-10 NOTE — ED Notes (Signed)
Pt c/o lower abdominal/pelvic pain w/ some white vaginal discharge x several days. Pt also c/o nausea, denies vomiting/diarrhea or burning w/ urination. LMP May or June - pt states she thinks she is pregnant. Pt also hx of UTIs.

## 2015-01-10 NOTE — Discharge Instructions (Signed)

## 2015-01-10 NOTE — ED Provider Notes (Signed)
CSN: [id number]     Arrival date & time 01/10/15  2426 History   First MD Initiated Contact with Patient 01/10/15 0818     Chief Complaint  Patient presents with  . Pelvic Pain  . Abdominal Pain     (Consider location/radiation/quality/duration/timing/severity/associated sxs/prior Treatment) HPI Comments: Patient is here with mild pelvic pain. Endorses some mildly increased vaginal discharge and urinary urgency. Denies fever, dysuria, other abdominal pain. She has mild nausea without vomiting or diarrhea. Her last menstrual period was May or June and she had a recent positive urinary pregnancy test. She has plans to terminate the pregnancy and has an appointment in 2 days at a local clinic for this. The clinic informed her prices would change based on the day of the pregnancy. She has not seen an OB for formal dating yet. She has history of UTIs and trichomonas but no history of PID, chlamydia, gonorrhea.  Patient is a 24 y.o. female presenting with pelvic pain and abdominal pain. The history is provided by the patient.  Pelvic Pain This is a new problem. The current episode started more than 1 week ago. The problem occurs constantly. The problem has not changed since onset.Pertinent negatives include no abdominal pain and no shortness of breath. Nothing aggravates the symptoms. Nothing relieves the symptoms.  Abdominal Pain Associated symptoms: vaginal bleeding (mild spotting) and vaginal discharge (mild, increase)   Associated symptoms: no cough, no fever, no hematuria, no shortness of breath and no vomiting     Past Medical History  Diagnosis Date  . Sickle cell trait   . Abnormal Pap smear    Past Surgical History  Procedure Laterality Date  . Colposcopy     Family History  Problem Relation Age of Onset  . Diabetes Maternal Aunt   . Diabetes Maternal Grandfather    Social History  Substance Use Topics  . Smoking status: Former Smoker    Quit date: 09/10/2009  . Smokeless  tobacco: Never Used  . Alcohol Use: Yes     Comment: occasional   OB History    Gravida Para Term Preterm AB TAB SAB Ectopic Multiple Living   2 2 2       2      Review of Systems  Constitutional: Negative for fever.  Respiratory: Negative for cough and shortness of breath.   Gastrointestinal: Negative for vomiting and abdominal pain.  Genitourinary: Positive for urgency, vaginal bleeding (mild spotting), vaginal discharge (mild, increase) and pelvic pain. Negative for hematuria and vaginal pain.  All other systems reviewed and are negative.     Allergies  Review of patient's allergies indicates no known allergies.  Home Medications   Prior to Admission medications   Medication Sig Start Date End Date Taking? Authorizing Provider  cephALEXin (KEFLEX) 500 MG capsule Take 1 capsule (500 mg total) by mouth 4 (four) times daily. 06/17/14   Carman Ching, PA-C  ibuprofen (ADVIL,MOTRIN) 800 MG tablet Take 1 tablet (800 mg total) by mouth 3 (three) times daily. Patient not taking: Reported on 06/17/2014 05/15/13   Larene Pickett, PA-C  methocarbamol (ROBAXIN) 500 MG tablet Take 1 tablet (500 mg total) by mouth 2 (two) times daily as needed. 05/15/13   Larene Pickett, PA-C  metroNIDAZOLE (FLAGYL) 500 MG tablet Take 1 tablet (500 mg total) by mouth 2 (two) times daily. One po bid x 7 days 06/17/14   Carman Ching, PA-C  Prenatal Vit-Fe Fumarate-FA (PRENATAL COMPLETE) 14-0.4 MG TABS Take 14 mg  by mouth daily. 06/17/14   Robyn M Hess, PA-C   BP 115/59 mmHg  Pulse 79  Temp(Src) 97.7 F (36.5 C) (Oral)  Resp 16  SpO2 100%  LMP 09/09/2014 (Within Weeks) Physical Exam  Constitutional: She is oriented to person, place, and time. She appears well-developed and well-nourished. No distress.  HENT:  Head: Normocephalic and atraumatic.  Mouth/Throat: Oropharynx is clear and moist.  Eyes: EOM are normal. Pupils are equal, round, and reactive to light.  Neck: Normal range of motion. Neck supple.   Cardiovascular: Normal rate and regular rhythm.  Exam reveals no friction rub.   No murmur heard. Pulmonary/Chest: Effort normal and breath sounds normal. No respiratory distress. She has no wheezes. She has no rales.  Abdominal: Soft. She exhibits no distension. There is tenderness (mild, diffuse lower). There is no rebound.  Musculoskeletal: Normal range of motion. She exhibits no edema.  Neurological: She is alert and oriented to person, place, and time.  Skin: No rash noted. She is not diaphoretic.  Nursing note and vitals reviewed.   ED Course  Procedures (including critical care time) Labs Review Labs Reviewed  URINE CULTURE  WET PREP, GENITAL  URINALYSIS, ROUTINE W REFLEX MICROSCOPIC (NOT AT [facility])  POC URINE PREG, ED  I-STAT BETA HCG BLOOD, ED ([facility], [facility], [facility] ONLY)  GC/CHLAMYDIA PROBE AMP ([place]) NOT AT [facility]    Imaging Review US Ob Limited  01/10/2015   CLINICAL DATA:  Pelvic pain for 2 weeks.  Unsure of LMP.  EXAM: LIMITED OBSTETRIC ULTRASOUND  FINDINGS: Number of Fetuses: 1  Heart Rate:  145 bpm  Movement: Yes  Presentation: Breech  Placental Location: Posterior  Previa: No  Amniotic Fluid (Subjective):  Within normal limits.  BPD:  27.8cm 15w  2d  MATERNAL FINDINGS:  Cervix:  Appears closed.  Uterus/Adnexae: Probable focal myometrial contraction noted in anterior uterine wall. No other significant abnormality visualized.  IMPRESSION: Single living IUP at approximately 15 weeks by BPD. No acute maternal findings visualized.  This exam is performed on an emergent basis and does not comprehensively evaluate fetal size, dating, or anatomy; follow-up complete OB US should be considered if further fetal assessment is warranted.   Electronically Signed   By: Earle Gell M.D.   On: 01/10/2015 09:42   I have personally reviewed and evaluated these images and lab results as part of my medical decision-making.   EKG Interpretation None      MDM   Final diagnoses:  UTI (lower  urinary tract infection)  Pregnancy    24 year old female here with lower abdominal pain and some mild discharge and urinary urgency. Will do pelvic exam, urine studies. We'll ultrasound her to date the pregnancy.  US shows 15 w pregnancy. UA shows UTI. GC/Ch sent, wet prep with whites, no BV, yeast, or trichomonas.   Discharged.  Evelina Bucy, MD 01/10/15 [phone]

## 2015-01-11 LAB — GC/CHLAMYDIA PROBE AMP (~~LOC~~) NOT AT ARMC
Chlamydia: NEGATIVE
Neisseria Gonorrhea: NEGATIVE

## 2015-01-12 LAB — URINE CULTURE
Culture: >=100000
SPECIAL REQUESTS: NORMAL

## 2015-01-15 ENCOUNTER — Telehealth (HOSPITAL_COMMUNITY): Payer: Self-pay

## 2015-01-15 NOTE — Telephone Encounter (Signed)
Post ED Visit - Positive Culture Follow-up  Culture report reviewed by antimicrobial stewardship pharmacist:  [x]  Heide Guile, Pharm.D., BCPS []  Alycia Rossetti, Pharm.D., BCPS []  [place], Pharm.D., BCPS, AAHIVP []  Legrand Como, Pharm.D., BCPS, AAHIVP []  [place], Pharm.D. []  Milus Glazier, [place].D.  Positive Urine culture, 100,000 colonies -> E Coli Treated with Cephalexin, organism sensitive to the same and no further patient follow-up is required at this time.  Dortha Kern 01/15/2015, 3:36 AM

## 2016-04-21 IMAGING — US US OB LIMITED
1 series · 14 of 18 positions shown · non-contrast
Comparison: none

CLINICAL DATA: Pelvic pain for 2 weeks.  Unsure of LMP.

EXAM:
LIMITED OBSTETRIC ULTRASOUND

[Series 1: us ob limited · 0.22mm/px · 18 acquisitions, 14 frames shown]
[im 1/18]
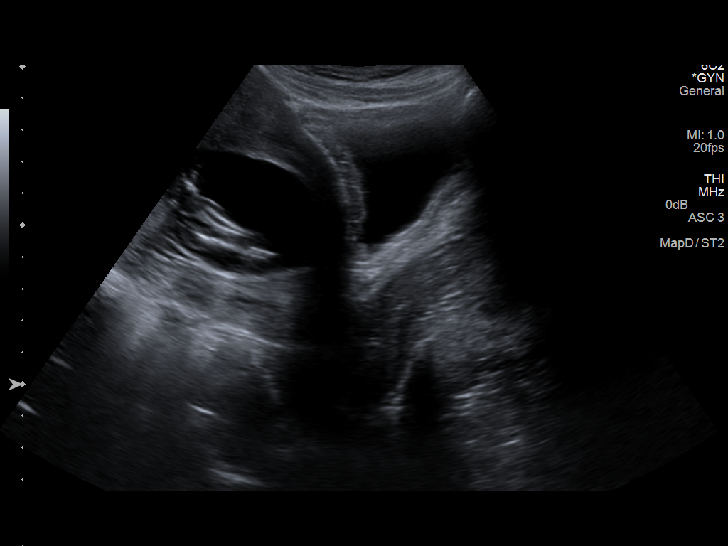
[im 2/18]
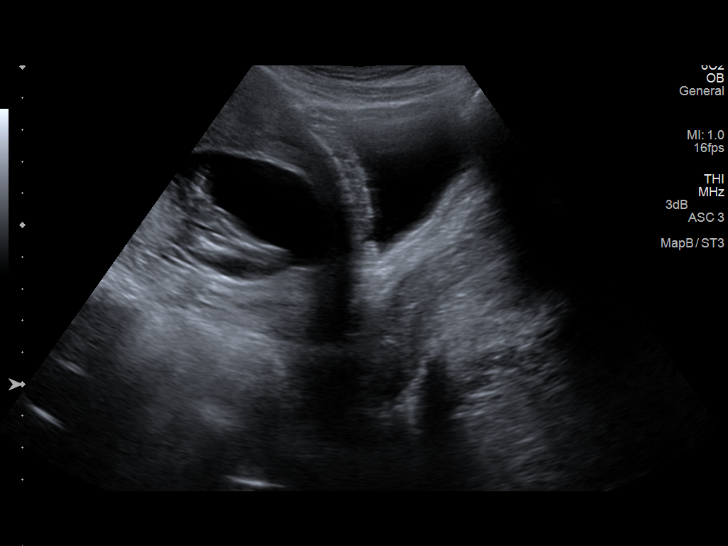
[im 4/18]
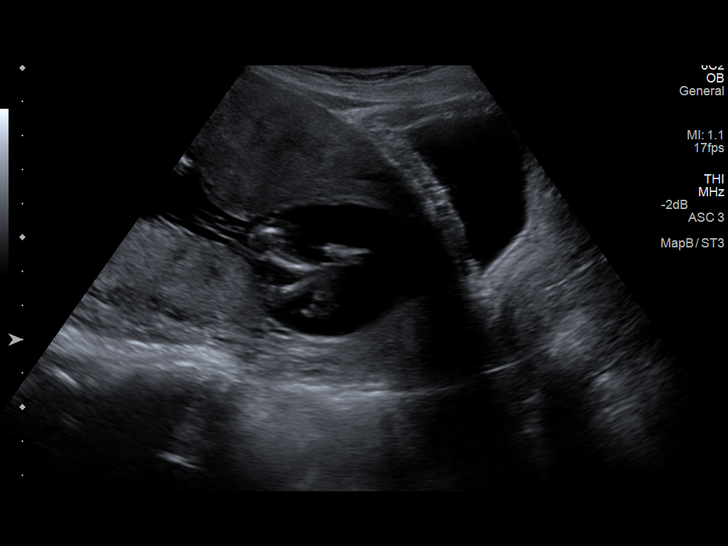
[im 5/18]
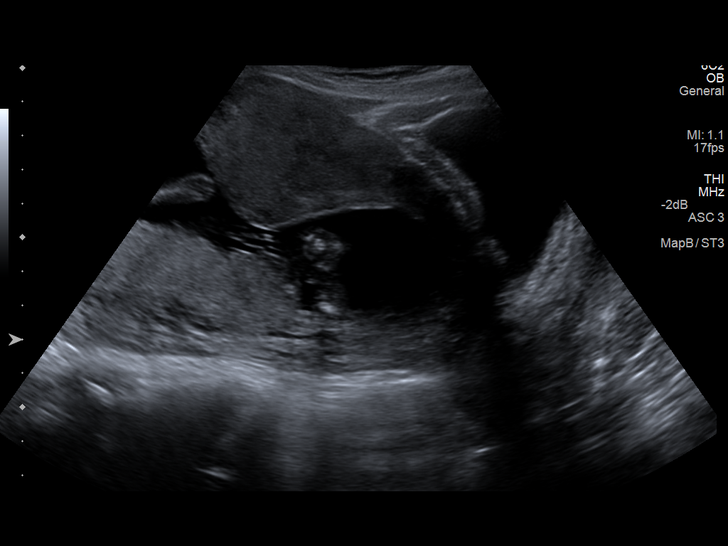
[im 6/18]
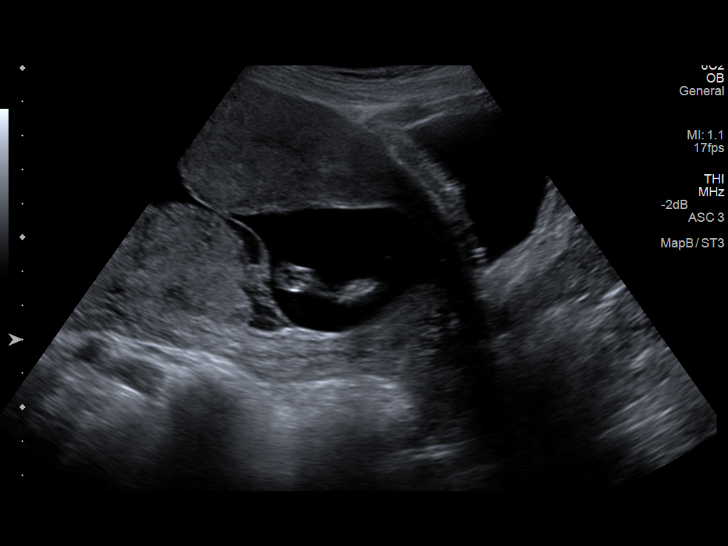
[im 8/18]
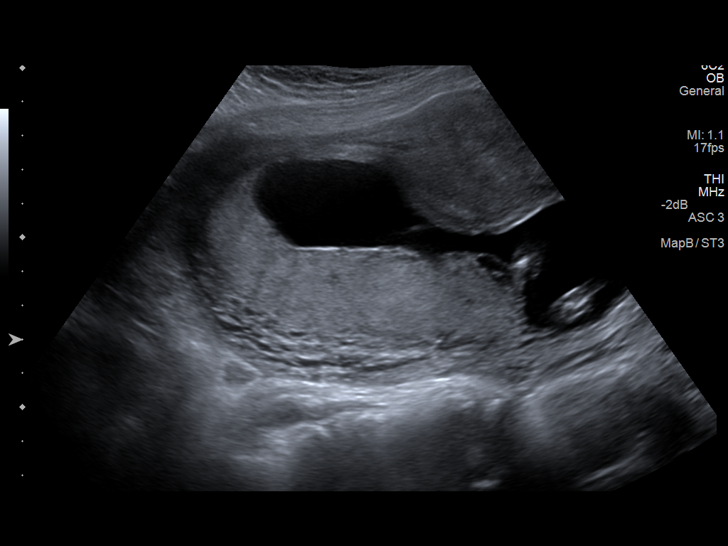
[im 9/18]
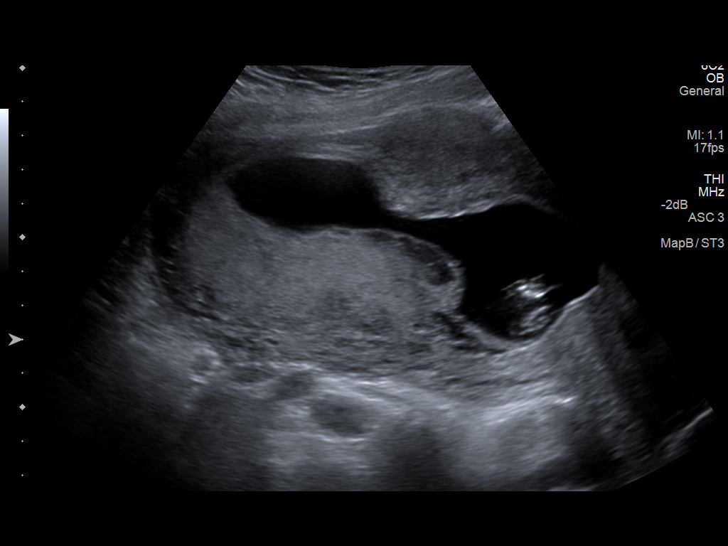
[im 10/18]
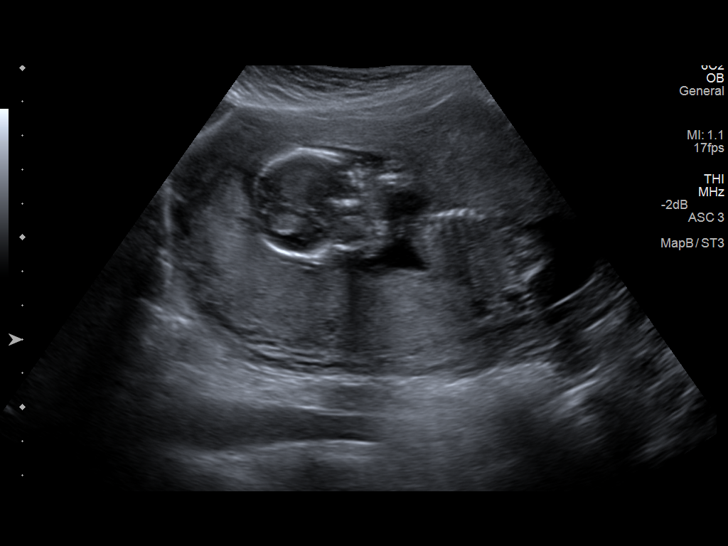
[im 11/18]
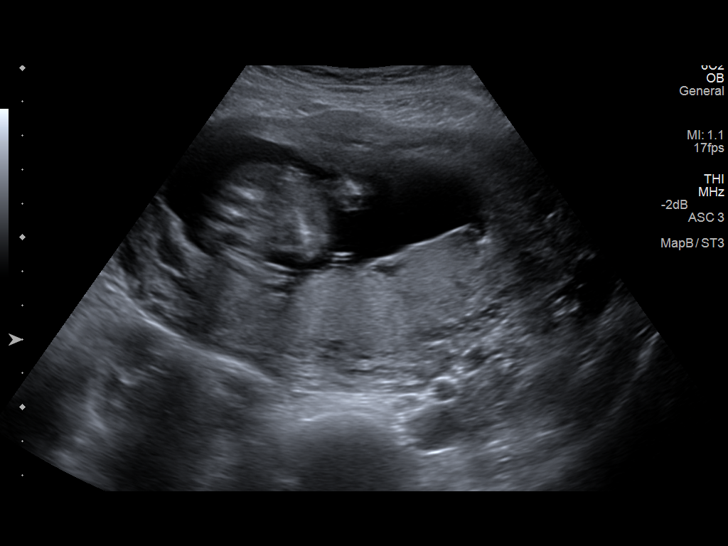
[im 13/18]
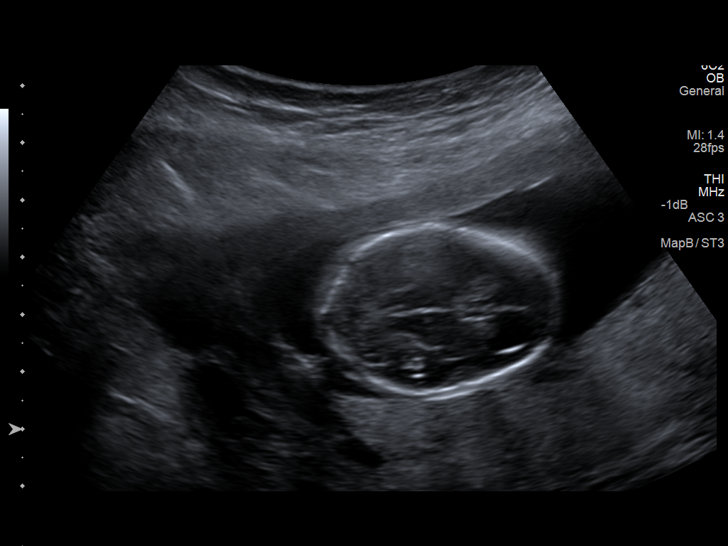
[im 14/18]
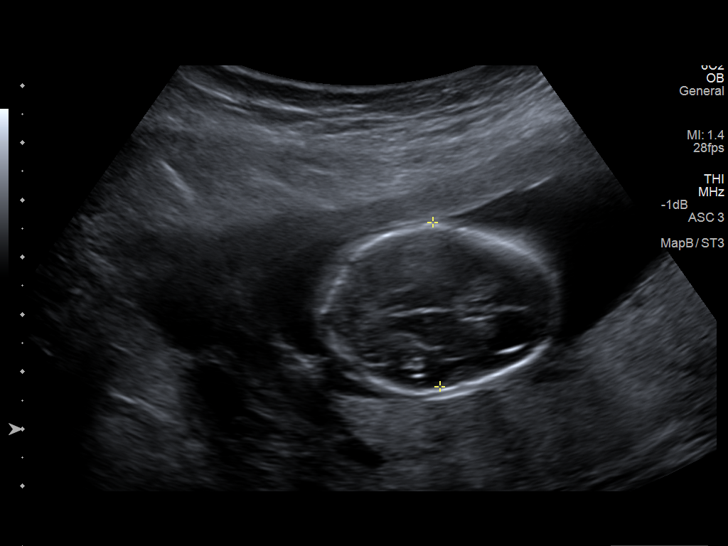
[im 15/18]
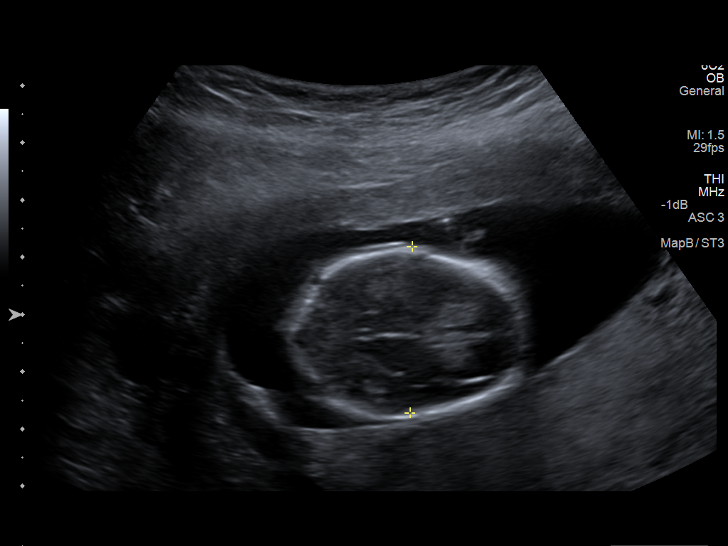
[im 17/18]
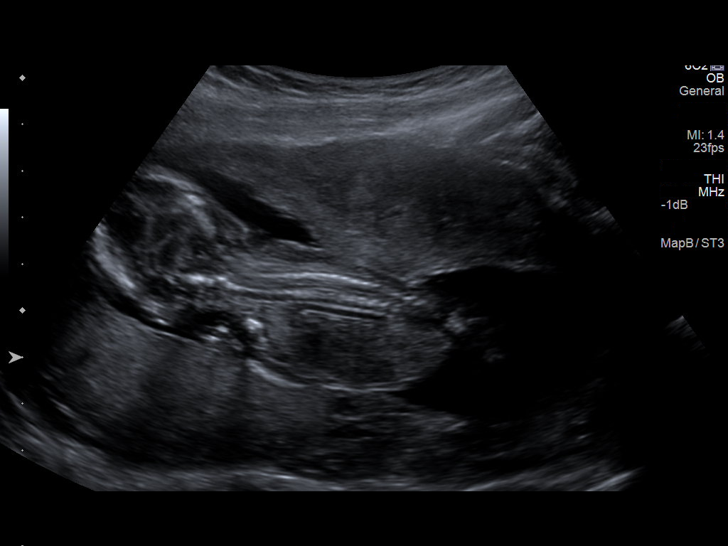
[im 18/18]
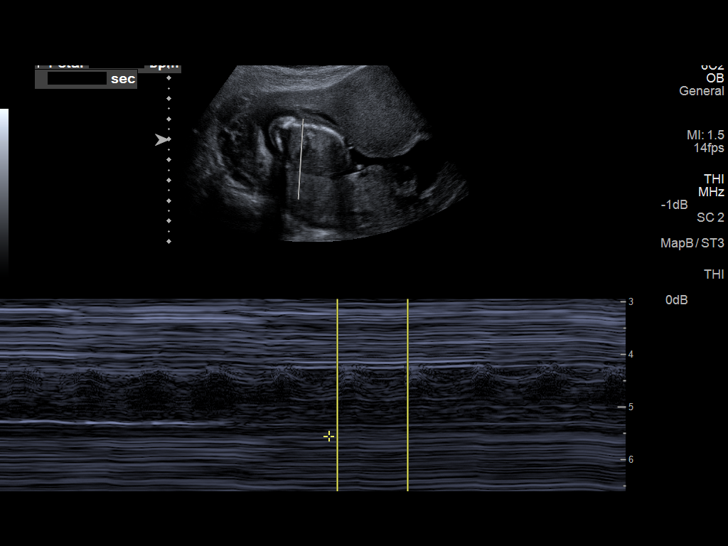

[14 of 18 positions shown; findings below may reference images not displayed]

FINDINGS: Number of Fetuses: 1

Heart Rate:  145 bpm

Movement: Yes

Presentation: Breech

Placental Location: Posterior

Previa: No

Amniotic Fluid (Subjective):  Within normal limits.

BPD:  27.8cm 15w  2d

MATERNAL FINDINGS:

Cervix:  Appears closed.

Uterus/Adnexae: Probable focal myometrial contraction noted in
anterior uterine wall. No other significant abnormality visualized.
IMPRESSION: Single living IUP at approximately 15 weeks by BPD. No acute
maternal findings visualized.

This exam is performed on an emergent basis and does not
comprehensively evaluate fetal size, dating, or anatomy; follow-up
complete OB US should be considered if further fetal assessment is
warranted.

## 2017-05-12 ENCOUNTER — Encounter (HOSPITAL_COMMUNITY): Payer: Self-pay | Admitting: *Deleted

## 2017-05-12 ENCOUNTER — Emergency Department (HOSPITAL_COMMUNITY)
Admission: EM | Admit: 2017-05-12 | Discharge: 2017-05-12 | Disposition: A | Discharge status: Home / Self Care | Payer: Managed Care, Other (non HMO) | Attending: Emergency Medicine | Admitting: Emergency Medicine

## 2017-05-12 ENCOUNTER — Other Ambulatory Visit: Payer: Self-pay

## 2017-05-12 DIAGNOSIS — N10 Acute tubulo-interstitial nephritis: Secondary | ICD-10-CM | POA: Insufficient documentation

## 2017-05-12 DIAGNOSIS — Z87891 Personal history of nicotine dependence: Secondary | ICD-10-CM | POA: Insufficient documentation

## 2017-05-12 DIAGNOSIS — N12 Tubulo-interstitial nephritis, not specified as acute or chronic: Secondary | ICD-10-CM

## 2017-05-12 DIAGNOSIS — Z79899 Other long term (current) drug therapy: Secondary | ICD-10-CM | POA: Insufficient documentation

## 2017-05-12 LAB — COMPREHENSIVE METABOLIC PANEL
ALT: 17 U/L (ref 14–54)
ANION GAP: 12 (ref 5–15)
AST: 19 U/L (ref 15–41)
Albumin: 4 g/dL (ref 3.5–5.0)
Alkaline Phosphatase: 92 U/L (ref 38–126)
BUN: 10 mg/dL (ref 6–20)
CHLORIDE: 104 mmol/L (ref 101–111)
CO2: 23 mmol/L (ref 22–32)
Calcium: 9.5 mg/dL (ref 8.9–10.3)
Creatinine, Ser: 0.8 mg/dL (ref 0.44–1.00)
Glucose, Bld: 96 mg/dL (ref 65–99)
POTASSIUM: 3.9 mmol/L (ref 3.5–5.1)
Sodium: 139 mmol/L (ref 135–145)
Total Bilirubin: 2.2 mg/dL — ABNORMAL HIGH (ref 0.3–1.2)
Total Protein: 8.6 g/dL — ABNORMAL HIGH (ref 6.5–8.1)

## 2017-05-12 LAB — CBC WITH DIFFERENTIAL/PLATELET
BASOS ABS: 0 10*3/uL (ref 0.0–0.1)
Basophils Relative: 0 %
EOS PCT: 0 %
Eosinophils Absolute: 0 10*3/uL (ref 0.0–0.7)
HCT: 45.1 % (ref 36.0–46.0)
Hemoglobin: 15.8 g/dL — ABNORMAL HIGH (ref 12.0–15.0)
LYMPHS PCT: 28 %
Lymphs Abs: 2 10*3/uL (ref 0.7–4.0)
MCH: 32.4 pg (ref 26.0–34.0)
MCHC: 35 g/dL (ref 30.0–36.0)
MCV: 92.4 fL (ref 78.0–100.0)
MONO ABS: 0.8 10*3/uL (ref 0.1–1.0)
Monocytes Relative: 11 %
Neutro Abs: 4.4 10*3/uL (ref 1.7–7.7)
Neutrophils Relative %: 61 %
PLATELETS: 352 10*3/uL (ref 150–400)
RBC: 4.88 MIL/uL (ref 3.87–5.11)
RDW: 13.4 % (ref 11.5–15.5)
WBC: 7.3 10*3/uL (ref 4.0–10.5)

## 2017-05-12 LAB — URINALYSIS, ROUTINE W REFLEX MICROSCOPIC
BILIRUBIN URINE: NEGATIVE
Glucose, UA: NEGATIVE mg/dL
Ketones, ur: 20 mg/dL — AB
Nitrite: POSITIVE — AB
Protein, ur: NEGATIVE mg/dL
SPECIFIC GRAVITY, URINE: 1.013 (ref 1.005–1.030)
pH: 6 (ref 5.0–8.0)

## 2017-05-12 LAB — POC URINE PREG, ED: PREG TEST UR: NEGATIVE

## 2017-05-12 MED ORDER — MORPHINE SULFATE (PF) 4 MG/ML IV SOLN
4.0000 mg | Freq: Once | INTRAVENOUS | Status: AC
  Administered 2017-05-12: 4 mg via INTRAVENOUS
  Filled 2017-05-12: qty 1

## 2017-05-12 MED ORDER — ONDANSETRON HCL 4 MG/2ML IJ SOLN
4.0000 mg | Freq: Once | INTRAMUSCULAR | Status: AC
  Administered 2017-05-12: 4 mg via INTRAVENOUS
  Filled 2017-05-12: qty 2

## 2017-05-12 MED ORDER — SODIUM CHLORIDE 0.9 % IV BOLUS (SEPSIS)
1000.0000 mL | Freq: Once | INTRAVENOUS | Status: AC
  Administered 2017-05-12: 1000 mL via INTRAVENOUS

## 2017-05-12 MED ORDER — CEFTRIAXONE SODIUM 1 G IJ SOLR
1.0000 g | Freq: Once | INTRAMUSCULAR | Status: AC
  Administered 2017-05-12: 1 g via INTRAVENOUS
  Filled 2017-05-12: qty 10

## 2017-05-12 MED ORDER — ONDANSETRON 4 MG PO TBDP: ORAL_TABLET | ORAL | 0 refills | Status: DC

## 2017-05-12 MED ORDER — CEPHALEXIN 500 MG PO CAPS: 500.0000 mg | ORAL_CAPSULE | Freq: Three times a day (TID) | ORAL | 0 refills | Status: AC

## 2017-05-12 NOTE — ED Notes (Signed)
Gave pt water. Tolerating well.

## 2017-05-12 NOTE — ED Triage Notes (Signed)
Pt reports hx of multiple UTIs. Pt having left side back and flank pain since Monday. Denies fever or pain with urination but states her urine is dark.

## 2017-05-12 NOTE — ED Provider Notes (Signed)
[place] EMERGENCY DEPARTMENT Provider Note   CSN: [id number] Arrival date & time: 05/12/17  0827     History   Chief Complaint Chief Complaint  Patient presents with  . Back Pain  . Flank Pain    HPI  Connie Duffy is a 27 y.o. Female history of sickle cell trait, and multiple UTIs, presents to the ED for evaluation of sided back pain and flank pain since Monday.  Patient reports history of previous kidney infections and this felt very similar.  Patient denies burning with urination or urinary frequency, but does report her urine has been dark and foul-smelling and this is classic of her previous kidney infections.  Patient denies any fevers or pain.  Does report a few episodes of emesis with some nausea, denies any abdominal pain.  Patient denies any vaginal discharge or pelvic discomfort.       Past Medical History:  Diagnosis Date  . Abnormal Pap smear   . Sickle cell trait ([place])     There are no active problems to display for this patient.   Past Surgical History:  Procedure Laterality Date  . COLPOSCOPY      OB History    Gravida Para Term Preterm AB Living   2 2 2     2    SAB TAB Ectopic Multiple Live Births           2       Home Medications    Prior to Admission medications   Medication Sig Start Date End Date Taking? Authorizing Provider  acetaminophen (TYLENOL) 500 MG tablet Take 500 mg by mouth every 6 (six) hours as needed for mild pain or headache.   Yes [provider]  Prenatal Vit-Fe Fumarate-FA (PRENATAL COMPLETE) 14-0.4 MG TABS Take 14 mg by mouth daily. Patient not taking: Reported on 01/10/2015 06/17/14   Carman Ching, PA-C    Family History Family History  Problem Relation Age of Onset  . Diabetes Maternal Aunt   . Diabetes Maternal Grandfather     Social History Social History   Tobacco Use  . Smoking status: Former Smoker    Last attempt to quit: 09/10/2009    Years since quitting: 7.6  .  Smokeless tobacco: Never Used  Substance Use Topics  . Alcohol use: Yes    Comment: occasional  . Drug use: No     Allergies   Patient has no known allergies.   Review of Systems Review of Systems  Constitutional: Negative for chills and fever.  Respiratory: Negative for cough.   Gastrointestinal: Positive for nausea and vomiting. Negative for abdominal pain, blood in stool and diarrhea.  Genitourinary: Positive for flank pain, frequency and hematuria. Negative for difficulty urinating, dysuria, pelvic pain, vaginal bleeding, vaginal discharge and vaginal pain.  Musculoskeletal: Positive for back pain.     Physical Exam Updated Vital Signs BP 112/66   Pulse 75   Temp 98.5 F (36.9 C) (Oral)   Resp 16   SpO2 97%   Physical Exam  Constitutional: She is oriented to person, place, and time. She appears well-developed and well-nourished. No distress.  HENT:  Head: Normocephalic and atraumatic.  Mouth/Throat: Oropharynx is clear and moist.  Eyes: Right eye exhibits no discharge. Left eye exhibits no discharge.  Cardiovascular: Normal rate, regular rhythm and normal heart sounds.  Pulmonary/Chest: Effort normal and breath sounds normal. No stridor. No respiratory distress. She has no wheezes. She has no rales.  Abdominal: Soft.  Bowel sounds are normal. She exhibits no distension and no mass. There is no tenderness. There is no guarding.  Abdomen is nontender to palpation in all 4 quadrants, no guarding or rebound tenderness, there is moderate CVA tenderness on the left side, none on the right  Musculoskeletal: She exhibits no edema or deformity.  Neurological: She is alert and oriented to person, place, and time. Coordination normal.  Skin: Skin is warm and dry. Capillary refill takes less than 2 seconds. She is not diaphoretic.  Psychiatric: She has a normal mood and affect. Her behavior is normal.  Nursing note and vitals reviewed.    ED Treatments / Results  Labs (all  labs ordered are listed, but only abnormal results are displayed) Labs Reviewed  URINALYSIS, ROUTINE W REFLEX MICROSCOPIC - Abnormal; Notable for the following components:      Result Value   APPearance CLOUDY (*)    Hgb urine dipstick MODERATE (*)    Ketones, ur 20 (*)    Nitrite POSITIVE (*)    Leukocytes, UA LARGE (*)    Bacteria, UA MANY (*)    Squamous Epithelial / LPF 0-5 (*)    Non Squamous Epithelial 0-5 (*)    All other components within normal limits  CBC WITH DIFFERENTIAL/PLATELET - Abnormal; Notable for the following components:   Hemoglobin 15.8 (*)    All other components within normal limits  COMPREHENSIVE METABOLIC PANEL - Abnormal; Notable for the following components:   Total Protein 8.6 (*)    Total Bilirubin 2.2 (*)    All other components within normal limits  URINE CULTURE  POC URINE PREG, ED    EKG  EKG Interpretation None       Radiology No results found.  Procedures Procedures (including critical care time)  Medications Ordered in ED Medications  sodium chloride 0.9 % bolus 1,000 mL (0 mLs Intravenous Stopped 05/12/17 1239)  morphine 4 MG/ML injection 4 mg (4 mg Intravenous Given 05/12/17 1145)  ondansetron (ZOFRAN) injection 4 mg (4 mg Intravenous Given 05/12/17 1145)  cefTRIAXone (ROCEPHIN) 1 g in dextrose 5 % 50 mL IVPB (0 g Intravenous Stopped 05/12/17 1239)     Initial Impression / Assessment and Plan / ED Course  I have reviewed the triage vital signs and the nursing notes.  Pertinent labs & imaging results that were available during my care of the patient were reviewed by me and considered in my medical decision making (see chart for details).  Patient with history of multiple UTIs and episodes of uncomplicated pyelonephritis, resents for evaluation of left flank and back pain, and urinary discoloration and odor.  Patient denies associated fevers, has had some mild nausea and vomiting, no abdominal pain, no associated vaginal discharge or  pelvic pain.  On exam patient is overall well-appearing although feels uncomfortable, vital signs are normal, no fever, tachycardia or tachypnea to suggest sepsis.  Patient does have left CVA tenderness, abdomen is completely nontender to palpation.  UA with clear evidence of infection, moderate hemoglobin present, many bacteria, too numerous to count WBCs and WBC clumps, highly suggestive of pyelonephritis given clinical picture.  Without any vaginal discharge, or pelvic pain, and no abdominal tenderness, highly doubt STI or PID. Will give fluids, pain medication and Zofran, as well as 1 g of Rocephin, and get general labs.  Patient without leukocytosis, creatinine normal, no electrolyte derangements requiring prevention.  Urine culture in process.  On reevaluation patient reports pain is much improved, no additional episodes of emesis,  and is tolerating p.o. here in the ED.  At this time patient is stable for discharge with Keflex and Zofran.  Patient is aware she has urine culture pending will be contacted in a few days if an antibiotic change needs to be made.  Patient to follow-up with her primary doctor, strict return precautions provided.  Patient expresses understanding and is in agreement with plan.  Final Clinical Impressions(s) / ED Diagnoses   Final diagnoses:  Pyelonephritis    ED Discharge Orders        Ordered    cephALEXin (KEFLEX) 500 MG capsule  3 times daily     05/12/17 1324    ondansetron (ZOFRAN ODT) 4 MG disintegrating tablet     05/12/17 1324       Benedetto Goad [place], [place] 05/12/17 1327    Fredia Sorrow, MD 05/13/17 [phone]

## 2017-05-12 NOTE — Discharge Instructions (Signed)
Please complete entire course of antibiotics as directed even if symptoms improve, you may use Zofran as needed for nausea, ibuprofen or Tylenol for pain.  Please use the phone number provided on your paperwork to establish a primary care doctor and follow-up early next week, or follow-up with the [facility].  If you have worsening pain, persistent fevers, or nausea and vomiting and or unable to keep down your antibiotics please return to the ED for reevaluation.  Get help right away if: You cannot take your medicine or drink fluids as told. You have chills and shaking. You throw up (vomit). You have very bad pain in your side (flank) or back. You feel very weak or you pass out (faint).

## 2017-05-14 LAB — URINE CULTURE: Culture: >=100000 — AB

## 2017-05-15 ENCOUNTER — Telehealth: Payer: Self-pay

## 2017-05-15 NOTE — Telephone Encounter (Signed)
Post ED Visit - Positive Culture Follow-up  Culture report reviewed by antimicrobial stewardship pharmacist:  []  Elenor Quinones, Pharm.D. []  Heide Guile, Pharm.D., BCPS AQ-ID []  [place], Pharm.D., BCPS []  Alycia Rossetti, Pharm.D., BCPS []  [place], Pharm.D., BCPS, AAHIVP []  Legrand Como, Pharm.D., BCPS, AAHIVP []  Salome Arnt, PharmD, BCPS []  Jalene Mullet, PharmD []  Vincenza Hews, PharmD, BCPS [facility] Pharm D Positive urine culture Treated with Cephalexin, organism sensitive to the same and no further patient follow-up is required at this time.  Genia Del 05/15/2017, 9:31 AM

## 2017-07-30 ENCOUNTER — Emergency Department (HOSPITAL_COMMUNITY): Payer: Self-pay

## 2017-07-30 ENCOUNTER — Emergency Department (HOSPITAL_COMMUNITY)
Admission: EM | Admit: 2017-07-30 | Discharge: 2017-07-30 | Disposition: A | Discharge status: Home / Self Care | Payer: Self-pay | Attending: Emergency Medicine | Admitting: Emergency Medicine

## 2017-07-30 ENCOUNTER — Other Ambulatory Visit: Payer: Self-pay

## 2017-07-30 ENCOUNTER — Encounter (HOSPITAL_COMMUNITY): Payer: Self-pay | Admitting: *Deleted

## 2017-07-30 DIAGNOSIS — Z79899 Other long term (current) drug therapy: Secondary | ICD-10-CM | POA: Insufficient documentation

## 2017-07-30 DIAGNOSIS — Z87891 Personal history of nicotine dependence: Secondary | ICD-10-CM | POA: Insufficient documentation

## 2017-07-30 DIAGNOSIS — R0789 Other chest pain: Secondary | ICD-10-CM | POA: Insufficient documentation

## 2017-07-30 DIAGNOSIS — J069 Acute upper respiratory infection, unspecified: Secondary | ICD-10-CM | POA: Insufficient documentation

## 2017-07-30 LAB — POC URINE PREG, ED: Preg Test, Ur: NEGATIVE

## 2017-07-30 MED ORDER — BENZONATATE 100 MG PO CAPS: 100.0000 mg | ORAL_CAPSULE | Freq: Three times a day (TID) | ORAL | 0 refills | Status: DC

## 2017-07-30 MED ORDER — PREDNISONE 10 MG PO TABS: 20.0000 mg | ORAL_TABLET | Freq: Two times a day (BID) | ORAL | 0 refills | Status: DC

## 2017-07-30 NOTE — Discharge Instructions (Addendum)
Follow up with your doctor or return here if symptoms worsen.

## 2017-07-30 NOTE — ED Notes (Signed)
Patient given something to drink per edp request

## 2017-07-30 NOTE — ED Triage Notes (Signed)
Pt in c/o cough with cp with non productive cough, reports loosing her voice, x 2 days ago, denies ear pain, c/o fever and chills, A&O x4

## 2017-07-30 NOTE — ED Provider Notes (Signed)
[place] EMERGENCY DEPARTMENT Provider Note   CSN: [id number] Arrival date & time: 07/30/17  1028     History   Chief Complaint Chief Complaint  Patient presents with  . Chest Pain    HPI Connie Duffy is a 27 y.o. female who presents to the ED with cough x 2 days chills but no fever.   The history is provided by the patient. No language interpreter was used.  Influenza  Presenting symptoms: cough, headache, myalgias and rhinorrhea   Presenting symptoms: no diarrhea, no fever, no nausea, no shortness of breath, no sore throat and no vomiting   Severity:  Moderate Onset quality:  Gradual Duration:  3 days Progression:  Worsening Chronicity:  New Worsened by:  Nothing Ineffective treatments:  OTC medications Associated symptoms: chills, decreased appetite and nasal congestion   Associated symptoms: no ear pain, no neck stiffness and no syncope     Past Medical History:  Diagnosis Date  . Abnormal Pap smear   . Sickle cell trait ([place])     There are no active problems to display for this patient.   Past Surgical History:  Procedure Laterality Date  . COLPOSCOPY       OB History    Gravida  2   Para  2   Term  2   Preterm      AB      Living  2     SAB      TAB      Ectopic      Multiple      Live Births  2            Home Medications    Prior to Admission medications   Medication Sig Start Date End Date Taking? Authorizing Provider  acetaminophen (TYLENOL) 500 MG tablet Take 500 mg by mouth every 6 (six) hours as needed for mild pain or headache.    [provider]  benzonatate (TESSALON) 100 MG capsule Take 1 capsule (100 mg total) by mouth every 8 (eight) hours. 07/30/17   Ashley Murrain, NP  ondansetron (ZOFRAN ODT) 4 MG disintegrating tablet 4mg  ODT q4 hours prn nausea/vomit 05/12/17   Jacqlyn Larsen, PA-C  predniSONE (DELTASONE) 10 MG tablet Take 2 tablets (20 mg total) by mouth 2 (two) times daily with  a meal. 07/30/17   Ashley Murrain, NP  Prenatal Vit-Fe Fumarate-FA (PRENATAL COMPLETE) 14-0.4 MG TABS Take 14 mg by mouth daily. Patient not taking: Reported on 01/10/2015 06/17/14   Carman Ching, PA-C    Family History Family History  Problem Relation Age of Onset  . Diabetes Maternal Aunt   . Diabetes Maternal Grandfather     Social History Social History   Tobacco Use  . Smoking status: Former Smoker    Last attempt to quit: 09/10/2009    Years since quitting: 7.8  . Smokeless tobacco: Never Used  Substance Use Topics  . Alcohol use: Yes    Comment: occasional  . Drug use: No     Allergies   Patient has no known allergies.   Review of Systems Review of Systems  Constitutional: Positive for chills and decreased appetite. Negative for fever.  HENT: Positive for congestion and rhinorrhea. Negative for ear pain and sore throat.   Respiratory: Positive for cough. Negative for shortness of breath.   Cardiovascular: Positive for chest pain (with cough).  Gastrointestinal: Negative for abdominal pain, diarrhea, nausea and vomiting.  Genitourinary: Negative  for dysuria, frequency and urgency.  Musculoskeletal: Positive for myalgias. Negative for neck stiffness.  Skin: Negative for rash.  Neurological: Positive for headaches. Negative for syncope.  Hematological: Negative for adenopathy.  Psychiatric/Behavioral: Negative for confusion.     Physical Exam Updated Vital Signs BP 127/70 (BP Location: Right Arm)   Pulse 73   Temp 98.2 F (36.8 C) (Oral)   Resp 15   Ht 5\' 3"  (1.6 m)   Wt 73.9 kg (163 lb)   SpO2 100%   BMI 28.87 kg/m   Physical Exam  Constitutional: Connie Duffy appears well-developed and well-nourished. No distress.  HENT:  Head: Normocephalic and atraumatic.  Right Ear: Tympanic membrane normal.  Left Ear: Tympanic membrane normal.  Nose: Rhinorrhea present.  Mouth/Throat: Uvula is midline and mucous membranes are normal. No posterior oropharyngeal erythema.    Eyes: Pupils are equal, round, and reactive to light. Conjunctivae and EOM are normal.  Neck: Normal range of motion. Neck supple.  Cardiovascular: Normal rate and regular rhythm.  Pulmonary/Chest: Effort normal. No respiratory distress. Connie Duffy has no wheezes. Connie Duffy has no rales.  Abdominal: Soft. There is no tenderness.  Musculoskeletal: Normal range of motion.  Lymphadenopathy:    Connie Duffy has no cervical adenopathy.  Neurological: Connie Duffy is alert.  Skin: Skin is warm and dry.  Psychiatric: Connie Duffy has a normal mood and affect. Her behavior is normal.  Nursing note and vitals reviewed.    ED Treatments / Results  Labs (all labs ordered are listed, but only abnormal results are displayed) Labs Reviewed  POC URINE PREG, ED  Radiology Dg Chest 2 View  Result Date: 07/30/2017 CLINICAL DATA:  Cough and chest pain EXAM: CHEST - 2 VIEW COMPARISON:  04/14/2010 FINDINGS: The heart size and mediastinal contours are within normal limits. Both lungs are clear. The visualized skeletal structures are unremarkable. IMPRESSION: No active cardiopulmonary disease. Electronically Signed   By: Franchot Gallo M.D.   On: 07/30/2017 13:03    Procedures Procedures (including critical care time)  Medications Ordered in ED Medications - No data to display   Initial Impression / Assessment and Plan / ED Course  I have reviewed the triage vital signs and the nursing notes. Pt CXR negative for acute infiltrate. Patients symptoms are consistent with URI, likely viral etiology. Discussed that antibiotics are not indicated for viral infections. Pt will be discharged with symptomatic treatment.  Verbalizes understanding and is agreeable with plan. Pt is hemodynamically stable & in NAD prior to dc. Final Clinical Impressions(s) / ED Diagnoses   Final diagnoses:  Acute URI  Chest wall pain    ED Discharge Orders        Ordered    benzonatate (TESSALON) 100 MG capsule  Every 8 hours     07/30/17 1349    predniSONE  (DELTASONE) 10 MG tablet  2 times daily with meals     07/30/17 1349       Janit Bern [place], NP 07/30/17 1352    Tegeler, Gwenyth Allegra, MD 07/30/17 [phone]

## 2017-07-30 NOTE — ED Notes (Signed)
Patient transported to X-ray 

## 2018-01-05 ENCOUNTER — Other Ambulatory Visit: Payer: Self-pay

## 2018-01-05 ENCOUNTER — Emergency Department (HOSPITAL_COMMUNITY): Payer: Self-pay

## 2018-01-05 ENCOUNTER — Emergency Department (HOSPITAL_COMMUNITY)
Admission: EM | Admit: 2018-01-05 | Discharge: 2018-01-05 | Disposition: A | Discharge status: Home / Self Care | Payer: Self-pay | Attending: Emergency Medicine | Admitting: Emergency Medicine

## 2018-01-05 ENCOUNTER — Encounter (HOSPITAL_COMMUNITY): Payer: Self-pay

## 2018-01-05 DIAGNOSIS — F17210 Nicotine dependence, cigarettes, uncomplicated: Secondary | ICD-10-CM | POA: Insufficient documentation

## 2018-01-05 DIAGNOSIS — N3001 Acute cystitis with hematuria: Secondary | ICD-10-CM | POA: Insufficient documentation

## 2018-01-05 DIAGNOSIS — J189 Pneumonia, unspecified organism: Secondary | ICD-10-CM | POA: Insufficient documentation

## 2018-01-05 DIAGNOSIS — Z79899 Other long term (current) drug therapy: Secondary | ICD-10-CM | POA: Insufficient documentation

## 2018-01-05 LAB — URINALYSIS, ROUTINE W REFLEX MICROSCOPIC
Bilirubin Urine: NEGATIVE
Glucose, UA: NEGATIVE mg/dL
KETONES UR: NEGATIVE mg/dL
Nitrite: NEGATIVE
PH: 6 (ref 5.0–8.0)
Protein, ur: NEGATIVE mg/dL
SPECIFIC GRAVITY, URINE: 1.01 (ref 1.005–1.030)
WBC, UA: >50 WBC/hpf — ABNORMAL HIGH (ref 0–5)

## 2018-01-05 LAB — POC URINE PREG, ED: Preg Test, Ur: NEGATIVE

## 2018-01-05 MED ORDER — ALBUTEROL SULFATE (2.5 MG/3ML) 0.083% IN NEBU
5.0000 mg | INHALATION_SOLUTION | Freq: Once | RESPIRATORY_TRACT | Status: AC
  Administered 2018-01-05: 5 mg via RESPIRATORY_TRACT
  Filled 2018-01-05: qty 6

## 2018-01-05 MED ORDER — ACETAMINOPHEN-CODEINE 120-12 MG/5ML PO SOLN: 10.0000 mL | ORAL | 0 refills | Status: DC | PRN

## 2018-01-05 MED ORDER — SODIUM CHLORIDE 0.9 % IV BOLUS
1000.0000 mL | Freq: Once | INTRAVENOUS | Status: AC
  Administered 2018-01-05: 1000 mL via INTRAVENOUS

## 2018-01-05 MED ORDER — ALBUTEROL SULFATE HFA 108 (90 BASE) MCG/ACT IN AERS
2.0000 | INHALATION_SPRAY | RESPIRATORY_TRACT | Status: DC | PRN
  Administered 2018-01-05: 2 via RESPIRATORY_TRACT
  Filled 2018-01-05: qty 6.7

## 2018-01-05 MED ORDER — GUAIFENESIN ER 1200 MG PO TB12: 1.0000 | ORAL_TABLET | Freq: Two times a day (BID) | ORAL | 0 refills | Status: DC

## 2018-01-05 MED ORDER — OPTICHAMBER DIAMOND MISC
1.0000 | Freq: Once | Status: AC
  Administered 2018-01-05: 1
  Filled 2018-01-05: qty 1

## 2018-01-05 MED ORDER — LEVOFLOXACIN 750 MG PO TABS: 750.0000 mg | ORAL_TABLET | Freq: Every day | ORAL | 0 refills | Status: DC

## 2018-01-05 NOTE — Discharge Instructions (Signed)
Return here as needed. Follow up with a primary doctor. Increase your fluid intake.

## 2018-01-05 NOTE — ED Notes (Signed)
ED Provider at bedside. 

## 2018-01-05 NOTE — ED Notes (Signed)
Patient transported to X-ray 

## 2018-01-05 NOTE — ED Triage Notes (Signed)
Pt states that she is having sinus pressure, and headaches since Friday. Noticed blood in her urine X2 days.

## 2018-01-05 NOTE — ED Provider Notes (Signed)
[facility] EMERGENCY DEPARTMENT Provider Note   CSN: [id number] Arrival date & time: 01/05/18  0725     History   Chief Complaint Chief Complaint  Patient presents with  . Hematuria  . Generalized Body Aches    HPI Connie Duffy is a 27 y.o. female.  HPI Patient presents to the emergency department with nasal congestion headache cough over the last 4 days.  She states she also noticed some blood in her urine for the last 2 days.  The patient states she has some general body aches as well.  Patient states she did not take any medications prior to arrival for her symptoms.  The patient denies chest pain, shortness of breath, headache,blurred vision, neck pain, fever, cough, weakness, numbness, dizziness, anorexia, edema, abdominal pain, nausea, vomiting, diarrhea, rash, back pain, dysuria, hematemesis, bloody stool, near syncope, or syncope. Past Medical History:  Diagnosis Date  . Abnormal Pap smear   . Sickle cell trait ([place])     There are no active problems to display for this patient.   Past Surgical History:  Procedure Laterality Date  . COLPOSCOPY       OB History    Gravida  2   Para  2   Term  2   Preterm      AB      Living  2     SAB      TAB      Ectopic      Multiple      Live Births  2            Home Medications    Prior to Admission medications   Medication Sig Start Date End Date Taking? Authorizing Provider  acetaminophen (TYLENOL) 500 MG tablet Take 500 mg by mouth every 6 (six) hours as needed for mild pain or headache.    [provider]  benzonatate (TESSALON) 100 MG capsule Take 1 capsule (100 mg total) by mouth every 8 (eight) hours. 07/30/17   Ashley Murrain, NP  ondansetron (ZOFRAN ODT) 4 MG disintegrating tablet 4mg  ODT q4 hours prn nausea/vomit 05/12/17   Jacqlyn Larsen, PA-C  predniSONE (DELTASONE) 10 MG tablet Take 2 tablets (20 mg total) by mouth 2 (two) times daily with a meal. 07/30/17    Ashley Murrain, NP  Prenatal Vit-Fe Fumarate-FA (PRENATAL COMPLETE) 14-0.4 MG TABS Take 14 mg by mouth daily. Patient not taking: Reported on 01/10/2015 06/17/14   Carman Ching, PA-C    Family History Family History  Problem Relation Age of Onset  . Diabetes Maternal Aunt   . Diabetes Maternal Grandfather     Social History Social History   Tobacco Use  . Smoking status: Current Some Day Smoker    Types: Cigars    Last attempt to quit: 09/10/2009    Years since quitting: 8.3  . Smokeless tobacco: Never Used  Substance Use Topics  . Alcohol use: Yes    Comment: occasional  . Drug use: No     Allergies   Patient has no known allergies.   Review of Systems Review of Systems All other systems negative except as documented in the HPI. All pertinent positives and negatives as reviewed in the HPI.  Physical Exam Updated Vital Signs BP 116/68   Pulse 91   Temp 99.1 F (37.3 C) (Oral)   Ht 5\' 3"  (1.6 m)   Wt 71.7 kg   LMP 12/22/2017   SpO2 100%  BMI 27.99 kg/m   Physical Exam  Constitutional: She is oriented to person, place, and time. She appears well-developed and well-nourished. No distress.  HENT:  Head: Normocephalic and atraumatic.  Mouth/Throat: Oropharynx is clear and moist.  Eyes: Pupils are equal, round, and reactive to light.  Neck: Normal range of motion. Neck supple.  Cardiovascular: Normal rate, regular rhythm and normal heart sounds. Exam reveals no gallop and no friction rub.  No murmur heard. Pulmonary/Chest: Effort normal and breath sounds normal. No respiratory distress. She has no wheezes.  Abdominal: Soft. Bowel sounds are normal. She exhibits no distension. There is no tenderness.  Neurological: She is alert and oriented to person, place, and time. She exhibits normal muscle tone. Coordination normal.  Skin: Skin is warm and dry. Capillary refill takes less than 2 seconds. No rash noted. No erythema.  Psychiatric: She has a normal mood and  affect. Her behavior is normal.  Nursing note and vitals reviewed.    ED Treatments / Results  Labs (all labs ordered are listed, but only abnormal results are displayed) Labs Reviewed  URINALYSIS, ROUTINE W REFLEX MICROSCOPIC - Abnormal; Notable for the following components:      Result Value   APPearance HAZY (*)    Hgb urine dipstick MODERATE (*)    Leukocytes, UA LARGE (*)    WBC, UA >50 (*)    Bacteria, UA MANY (*)    All other components within normal limits  POC URINE PREG, ED    EKG None  Radiology Dg Chest 2 View  Result Date: 01/05/2018 CLINICAL DATA:  Cough, congestion EXAM: CHEST - 2 VIEW COMPARISON:  07/30/2017 FINDINGS: Slightly increased markings anteriorly on the lateral view not well seen on the frontal view but may be within the lingula. Question early infiltrate/pneumonia. No effusions. Heart is normal size. No acute bony abnormality. IMPRESSION: Slight increased markings anteriorly on the lateral view, likely within the lingula. Cannot exclude early pneumonia/bronchopneumonia. Electronically Signed   By: Rolm Baptise M.D.   On: 01/05/2018 09:07    Procedures Procedures (including critical care time)  Medications Ordered in ED Medications  sodium chloride 0.9 % bolus 1,000 mL (1,000 mLs Intravenous New Bag/Given 01/05/18 0854)  albuterol (PROVENTIL) (2.5 MG/3ML) 0.083% nebulizer solution 5 mg (5 mg Nebulization Given 01/05/18 0855)     Initial Impression / Assessment and Plan / ED Course  I have reviewed the triage vital signs and the nursing notes.  Pertinent labs & imaging results that were available during my care of the patient were reviewed by me and considered in my medical decision making (see chart for details).    Patient will need treatment for an upper respiratory infection but appears to be a pneumonia on the chest x-ray along with a UTI.  Patient is advised to return here as needed.  She is given IV fluids and is been stable here in the  emergency department.  Final Clinical Impressions(s) / ED Diagnoses   Final diagnoses:  None    ED Discharge Orders    None       Dalia Heading, PA-C 01/11/18 1957    Fredia Sorrow, MD 01/12/18 [phone]

## 2018-03-31 ENCOUNTER — Emergency Department (HOSPITAL_COMMUNITY)
Admission: EM | Admit: 2018-03-31 | Discharge: 2018-03-31 | Disposition: A | Discharge status: Home / Self Care | Payer: Medicaid Other | Attending: Emergency Medicine | Admitting: Emergency Medicine

## 2018-03-31 ENCOUNTER — Encounter (HOSPITAL_COMMUNITY): Payer: Self-pay | Admitting: *Deleted

## 2018-03-31 DIAGNOSIS — T7840XA Allergy, unspecified, initial encounter: Secondary | ICD-10-CM | POA: Insufficient documentation

## 2018-03-31 DIAGNOSIS — F17290 Nicotine dependence, other tobacco product, uncomplicated: Secondary | ICD-10-CM | POA: Insufficient documentation

## 2018-03-31 DIAGNOSIS — Z79899 Other long term (current) drug therapy: Secondary | ICD-10-CM | POA: Insufficient documentation

## 2018-03-31 MED ORDER — DIPHENHYDRAMINE HCL 25 MG PO CAPS: 25.0000 mg | ORAL_CAPSULE | Freq: Once | ORAL | Status: DC

## 2018-03-31 MED ORDER — EPINEPHRINE 0.3 MG/0.3ML IJ SOAJ: 0.3000 mg | Freq: Once | INTRAMUSCULAR | 0 refills | Status: AC

## 2018-03-31 MED ORDER — PREDNISONE 10 MG PO TABS: 20.0000 mg | ORAL_TABLET | Freq: Every day | ORAL | 0 refills | Status: AC

## 2018-03-31 MED ORDER — FAMOTIDINE 20 MG PO TABS: 20.0000 mg | ORAL_TABLET | Freq: Once | ORAL | Status: DC

## 2018-03-31 MED ORDER — PREDNISONE 20 MG PO TABS
60.0000 mg | ORAL_TABLET | Freq: Once | ORAL | Status: AC
  Administered 2018-03-31: 60 mg via ORAL
  Filled 2018-03-31: qty 3

## 2018-03-31 MED ORDER — DIPHENHYDRAMINE HCL 50 MG/ML IJ SOLN
25.0000 mg | Freq: Once | INTRAMUSCULAR | Status: AC
  Administered 2018-03-31: 25 mg via INTRAVENOUS
  Filled 2018-03-31: qty 1

## 2018-03-31 MED ORDER — FAMOTIDINE IN NACL 20-0.9 MG/50ML-% IV SOLN
20.0000 mg | Freq: Once | INTRAVENOUS | Status: AC
  Administered 2018-03-31: 20 mg via INTRAVENOUS
  Filled 2018-03-31: qty 50

## 2018-03-31 NOTE — Discharge Instructions (Addendum)
Please read instructions below. Take 25mg  of benadryl every 6 hours, and 20mg  of pepcid every 12 hours for rash and itching.  Starting tomorrow, take prednisone as prescribed. Schedule an appointment with the Allergy specialist to follow up on your recurrent allergic reactions. Use the epipen and return to the ER immediately for feeling your throat closing, swelling of your lips or tongue, difficulty breathing, or new or concerning symptoms.

## 2018-03-31 NOTE — ED Triage Notes (Addendum)
Pt reports intermittent full body rash and lip swelling. Last episode started last night. Pt states that she is not allergic to anything. No new foods or skin irritants. Pt  speaking in full sentences at this time. Pt does report itching and irritation in her throat

## 2018-03-31 NOTE — ED Notes (Signed)
Patient left at this time with all belongings. 

## 2018-03-31 NOTE — ED Provider Notes (Signed)
[facility] EMERGENCY DEPARTMENT Provider Note   CSN: [id number] Arrival date & time: 03/31/18  0827     History   Chief Complaint Chief Complaint  Patient presents with  . Rash    HPI Connie Duffy is a 27 y.o. female with past medical history of sickle cell trait, presenting to the emergency department with complaint of lip and tongue swelling that began last night around 9 PM.  States she has been having issues with intermittent full body hives 2 weeks.  Last night she began feeling as though the right side of her upper lip was swollen as well as the right side of her tongue.  She states her throat feels slightly scratchy.  Upon awakening this morning, symptoms did not resolve and are persistent however not worsening.  Denies difficulty breathing or swallowing.  No interventions tried prior to arrival.  She does not have any rash today.  States as a child she had issues with intermittent hives similar to this, had allergy testing done which showed allergies to pet dander and dust, however no other obvious triggers.  She states she has not had issues with hives or allergic reactions like this for many years.  Denies any new personal products, recent new medications or antibiotics, new foods.  Does states she got her hair done 2 weeks ago, extensions, however her rash has been diffuse throughout her body and thinks this may be unrelated.  The history is provided by the patient.    Past Medical History:  Diagnosis Date  . Abnormal Pap smear   . Sickle cell trait ([place])     There are no active problems to display for this patient.   Past Surgical History:  Procedure Laterality Date  . COLPOSCOPY       OB History    Gravida  2   Para  2   Term  2   Preterm      AB      Living  2     SAB      TAB      Ectopic      Multiple      Live Births  2            Home Medications    Prior to Admission medications   Medication Sig Start Date  End Date Taking? Authorizing Provider  acetaminophen (TYLENOL) 500 MG tablet Take 500 mg by mouth every 6 (six) hours as needed for mild pain or headache.    [provider]  acetaminophen-codeine 120-12 MG/5ML solution Take 10 mLs by mouth every 4 (four) hours as needed for moderate pain. 01/05/18   Lawyer, Harrell Gave, PA-C  benzonatate (TESSALON) 100 MG capsule Take 1 capsule (100 mg total) by mouth every 8 (eight) hours. 07/30/17   Ashley Murrain, NP  EPINEPHrine (EPIPEN 2-PAK) 0.3 mg/0.3 mL IJ SOAJ injection Inject 0.3 mLs (0.3 mg total) into the muscle once for 1 dose. 03/31/18 03/31/18  Robinson, [place] N, PA-C  Guaifenesin 1200 MG TB12 Take 1 tablet (1,200 mg total) by mouth 2 (two) times daily. 01/05/18   Lawyer, Harrell Gave, PA-C  levofloxacin (LEVAQUIN) 750 MG tablet Take 1 tablet (750 mg total) by mouth daily. 01/05/18   Lawyer, Harrell Gave, PA-C  ondansetron (ZOFRAN ODT) 4 MG disintegrating tablet 4mg  ODT q4 hours prn nausea/vomit 05/12/17   Jacqlyn Larsen, PA-C  predniSONE (DELTASONE) 10 MG tablet Take 2 tablets (20 mg total) by mouth daily for 5 days.  03/31/18 04/05/18  Robinson, [place] N, PA-C  Prenatal Vit-Fe Fumarate-FA (PRENATAL COMPLETE) 14-0.4 MG TABS Take 14 mg by mouth daily. Patient not taking: Reported on 01/10/2015 06/17/14   Carman Ching, PA-C    Family History Family History  Problem Relation Age of Onset  . Diabetes Maternal Aunt   . Diabetes Maternal Grandfather     Social History Social History   Tobacco Use  . Smoking status: Current Some Day Smoker    Types: Cigars    Last attempt to quit: 09/10/2009    Years since quitting: 8.5  . Smokeless tobacco: Never Used  Substance Use Topics  . Alcohol use: Yes    Comment: occasional  . Drug use: No     Allergies   Patient has no known allergies.   Review of Systems Review of Systems  Constitutional: Negative for fever.  HENT: Positive for facial swelling. Negative for trouble swallowing and voice  change.   Respiratory: Negative for choking, shortness of breath and stridor.   Gastrointestinal: Negative for abdominal pain.  Skin: Positive for rash.  All other systems reviewed and are negative.    Physical Exam Updated Vital Signs BP 113/80   Pulse 61   Temp 98.1 F (36.7 C) (Oral)   Resp 19   Ht 5\' 3"  (1.6 m)   Wt 69.4 kg   SpO2 99%   BMI 27.10 kg/m   Physical Exam  Constitutional: She appears well-developed and well-nourished.  Tolerating secretions.  Normal work of breathing.  Speaking in full sentences.  HENT:  Head: Normocephalic and atraumatic.  No obvious swelling to lips or tongue on evaluation.  oropharynx is clear without swelling.  Eyes: Conjunctivae are normal.  Neck: Normal range of motion. Neck supple. No tracheal deviation present.  Cardiovascular: Normal rate, regular rhythm and normal heart sounds.  Pulmonary/Chest: Effort normal and breath sounds normal. No stridor. No respiratory distress.  Abdominal: Soft. Bowel sounds are normal. She exhibits no distension. There is no tenderness.  Neurological: She is alert.  Skin: Skin is warm. No rash noted.  Psychiatric: She has a normal mood and affect. Her behavior is normal.  Nursing note and vitals reviewed.    ED Treatments / Results  Labs (all labs ordered are listed, but only abnormal results are displayed) Labs Reviewed - No data to display  EKG None  Radiology No results found.  Procedures Procedures (including critical care time)  Medications Ordered in ED Medications  predniSONE (DELTASONE) tablet 60 mg (60 mg Oral Given 03/31/18 0953)  diphenhydrAMINE (BENADRYL) injection 25 mg (25 mg Intravenous Given 03/31/18 0953)  famotidine (PEPCID) IVPB 20 mg premix (0 mg Intravenous Stopped 03/31/18 1023)     Initial Impression / Assessment and Plan / ED Course  I have reviewed the triage vital signs and the nursing notes.  Pertinent labs & imaging results that were available during my care  of the patient were reviewed by me and considered in my medical decision making (see chart for details).  Clinical Course as of Mar 31 1330  Thu Mar 31, 2018  1130 Patient reevaluated.  Reports resolution in symptoms.  Will discharge symptomatic management and instruction to follow-up with allergist.   [JR]    Clinical Course User Index [JR] Robinson, [place] N, PA-C   Patient presenting with intermittent full body hives x2 weeks, acute onset of scratchy throat and subjective swelling of her lips and tongue last night at 9:00 PM.  Symptoms have not been worsening overnight.  No obvious etiology of suspected allergic reaction.  On exam, there is no obvious swelling to the lips or tongue.  Airway is patent.  Speaking in full sentences, normal work of breathing.  No rashes.  Given patient's symptoms, she was treated with IV Benadryl, Pepcid, and p.o. prednisone.  Monitored in the ED and reports resolution in symptoms after interventions.  After reevaluation, patient is hemodynamically stable, in no respiratory distress, and denies the feeling of throat closing. Pt has been advised to take OTC benadryl Pepcid, & return to the ED if they have a mod-severe allergic rxn (s/s including throat closing, difficulty breathing, swelling of lips face or tongue). Pt is to follow up with their PCP versus allergist.  Rx for EpiPen given as a precaution.  Pt is agreeable with plan & verbalizes understanding.  Patient discussed with Dr. Regenia Skeeter.  Discussed results, findings, treatment and follow up. Patient advised of return precautions. Patient verbalized understanding and agreed with plan.  Final Clinical Impressions(s) / ED Diagnoses   Final diagnoses:  Allergic reaction, initial encounter    ED Discharge Orders         Ordered    EPINEPHrine (EPIPEN 2-PAK) 0.3 mg/0.3 mL IJ SOAJ injection   Once     03/31/18 1133    predniSONE (DELTASONE) 10 MG tablet  Daily     03/31/18 1133           Robinson,  [place] N, [place] 03/31/18 1331    Sherwood Gambler, MD 04/01/18 [phone]

## 2018-06-24 ENCOUNTER — Other Ambulatory Visit: Payer: Self-pay

## 2018-06-24 ENCOUNTER — Emergency Department (HOSPITAL_COMMUNITY)
Admission: EM | Admit: 2018-06-24 | Discharge: 2018-06-24 | Disposition: A | Discharge status: Home / Self Care | Payer: Medicaid Other | Attending: Emergency Medicine | Admitting: Emergency Medicine

## 2018-06-24 DIAGNOSIS — B9689 Other specified bacterial agents as the cause of diseases classified elsewhere: Secondary | ICD-10-CM

## 2018-06-24 DIAGNOSIS — A599 Trichomoniasis, unspecified: Secondary | ICD-10-CM

## 2018-06-24 DIAGNOSIS — N12 Tubulo-interstitial nephritis, not specified as acute or chronic: Secondary | ICD-10-CM

## 2018-06-24 DIAGNOSIS — Z79899 Other long term (current) drug therapy: Secondary | ICD-10-CM | POA: Insufficient documentation

## 2018-06-24 DIAGNOSIS — N760 Acute vaginitis: Secondary | ICD-10-CM | POA: Insufficient documentation

## 2018-06-24 DIAGNOSIS — N10 Acute tubulo-interstitial nephritis: Secondary | ICD-10-CM | POA: Insufficient documentation

## 2018-06-24 DIAGNOSIS — B373 Candidiasis of vulva and vagina: Secondary | ICD-10-CM

## 2018-06-24 DIAGNOSIS — B3731 Acute candidiasis of vulva and vagina: Secondary | ICD-10-CM

## 2018-06-24 DIAGNOSIS — F17210 Nicotine dependence, cigarettes, uncomplicated: Secondary | ICD-10-CM | POA: Insufficient documentation

## 2018-06-24 LAB — BASIC METABOLIC PANEL
Anion gap: 10 (ref 5–15)
BUN: 16 mg/dL (ref 6–20)
CO2: 17 mmol/L — ABNORMAL LOW (ref 22–32)
Calcium: 8.9 mg/dL (ref 8.9–10.3)
Chloride: 106 mmol/L (ref 98–111)
Creatinine, Ser: 0.8 mg/dL (ref 0.44–1.00)
GFR calc Af Amer: >60 mL/min (ref >60)
GFR calc non Af Amer: >60 mL/min (ref >60)
Glucose, Bld: 129 mg/dL — ABNORMAL HIGH (ref 70–99)
Potassium: 4 mmol/L (ref 3.5–5.1)
Sodium: 133 mmol/L — ABNORMAL LOW (ref 135–145)

## 2018-06-24 LAB — CBC WITH DIFFERENTIAL/PLATELET
Abs Immature Granulocytes: 0.05 10*3/uL (ref 0.00–0.07)
Basophils Absolute: 0 10*3/uL (ref 0.0–0.1)
Basophils Relative: 0 %
Eosinophils Absolute: 0 10*3/uL (ref 0.0–0.5)
Eosinophils Relative: 0 %
HCT: 40.6 % (ref 36.0–46.0)
Hemoglobin: 13.8 g/dL (ref 12.0–15.0)
Immature Granulocytes: 0 %
Lymphocytes Relative: 3 %
Lymphs Abs: 0.5 10*3/uL — ABNORMAL LOW (ref 0.7–4.0)
MCH: 32.2 pg (ref 26.0–34.0)
MCHC: 34 g/dL (ref 30.0–36.0)
MCV: 94.9 fL (ref 80.0–100.0)
Monocytes Absolute: 0.8 10*3/uL (ref 0.1–1.0)
Monocytes Relative: 6 %
Neutro Abs: 12.3 10*3/uL — ABNORMAL HIGH (ref 1.7–7.7)
Neutrophils Relative %: 91 %
Platelets: 255 10*3/uL (ref 150–400)
RBC: 4.28 MIL/uL (ref 3.87–5.11)
RDW: 12.1 % (ref 11.5–15.5)
WBC: 13.7 10*3/uL — ABNORMAL HIGH (ref 4.0–10.5)
nRBC: 0 % (ref 0.0–0.2)

## 2018-06-24 LAB — URINALYSIS, ROUTINE W REFLEX MICROSCOPIC
Bilirubin Urine: NEGATIVE
Glucose, UA: NEGATIVE mg/dL
Ketones, ur: NEGATIVE mg/dL
Nitrite: NEGATIVE
Protein, ur: 30 mg/dL — AB
RBC / HPF: >50 RBC/hpf — ABNORMAL HIGH (ref 0–5)
Specific Gravity, Urine: 1.01 (ref 1.005–1.030)
WBC, UA: >50 WBC/hpf — ABNORMAL HIGH (ref 0–5)
pH: 5 (ref 5.0–8.0)

## 2018-06-24 LAB — PREGNANCY, URINE: Preg Test, Ur: NEGATIVE

## 2018-06-24 LAB — WET PREP, GENITAL
Sperm: NONE SEEN
Yeast Wet Prep HPF POC: NONE SEEN

## 2018-06-24 MED ORDER — ACETAMINOPHEN 500 MG PO TABS: 500.0000 mg | ORAL_TABLET | Freq: Four times a day (QID) | ORAL | 0 refills | Status: DC | PRN

## 2018-06-24 MED ORDER — METRONIDAZOLE 500 MG PO TABS: 500.0000 mg | ORAL_TABLET | Freq: Two times a day (BID) | ORAL | 0 refills | Status: DC

## 2018-06-24 MED ORDER — CEPHALEXIN 500 MG PO CAPS: 500.0000 mg | ORAL_CAPSULE | Freq: Four times a day (QID) | ORAL | 0 refills | Status: AC

## 2018-06-24 MED ORDER — IBUPROFEN 600 MG PO TABS: 600.0000 mg | ORAL_TABLET | Freq: Four times a day (QID) | ORAL | 0 refills | Status: DC | PRN

## 2018-06-24 MED ORDER — METRONIDAZOLE 500 MG PO TABS
500.0000 mg | ORAL_TABLET | Freq: Once | ORAL | Status: AC
  Administered 2018-06-24: 500 mg via ORAL
  Filled 2018-06-24: qty 1

## 2018-06-24 MED ORDER — CEPHALEXIN 250 MG PO CAPS
500.0000 mg | ORAL_CAPSULE | Freq: Once | ORAL | Status: AC
  Administered 2018-06-24: 500 mg via ORAL
  Filled 2018-06-24: qty 2

## 2018-06-24 MED ORDER — FLUCONAZOLE 150 MG PO TABS: 150.0000 mg | ORAL_TABLET | Freq: Every day | ORAL | 0 refills | Status: AC

## 2018-06-24 MED ORDER — FLUCONAZOLE 150 MG PO TABS
150.0000 mg | ORAL_TABLET | Freq: Once | ORAL | Status: AC
  Administered 2018-06-24: 150 mg via ORAL
  Filled 2018-06-24: qty 1

## 2018-06-24 NOTE — Discharge Instructions (Addendum)
Take Keflex as prescribed for your urinary tract infection.  Make sure to finish all this medication.  I am concerned this may be turning into a kidney infection.  Take Flagyl twice daily for 1 week for your trichomonas and bacterial vaginosis.  Do not drink alcohol within 24 hours of taking this medication.  Take Diflucan again in 72 hours for your vaginal yeast infection.  You can alternate ibuprofen and Tylenol as prescribed for your pain.  Please follow-up with women's outpatient clinic for annual exam and further evaluation and treatment of your recurrent UTI.  Please return to emergency department develop any new or worsening symptoms including severe, worsening flank pain, intractable vomiting, or any other concerning symptoms.

## 2018-06-24 NOTE — ED Triage Notes (Signed)
Pt here for evaluation of possible "bladder infection".  Pt states she has right sided flank pain, frequent urination, and cloudy malodorous urine that began yesterday.

## 2018-06-24 NOTE — ED Notes (Signed)
Discharge instructions (including medications) discussed with and copy provided to patient/caregiver.  Pt verbalizes understanding of discharge instructions.

## 2018-06-24 NOTE — ED Provider Notes (Signed)
[facility]PART[facility] Provider Note   CSN: [id number] Arrival date & time: 06/24/18  1443    History   Chief Complaint No chief complaint on file.   HPI Connie Duffy is a 28 y.o. female with history of sickle cell trait and recurrent UTI who presents with a 1 day history of malodorous urine, back pain, and mild dysuria.  She reports it feels typical of her UTIs in the past.  She has had history of pyelonephritis.  She denies any abnormal vaginal bleeding or discharge, abdominal pain, nausea, vomiting.  She is unsure if she has had fever at home.  She took ibuprofen prior to arrival with some relief.  She denies any concern for STD exposure.     HPI  Past Medical History:  Diagnosis Date  . Abnormal Pap smear   . Sickle cell trait ([place])     There are no active problems to display for this patient.   Past Surgical History:  Procedure Laterality Date  . COLPOSCOPY       OB History    Gravida  2   Para  2   Term  2   Preterm      AB      Living  2     SAB      TAB      Ectopic      Multiple      Live Births  2            Home Medications    Prior to Admission medications   Medication Sig Start Date End Date Taking? Authorizing Provider  acetaminophen (TYLENOL) 500 MG tablet Take 1 tablet (500 mg total) by mouth every 6 (six) hours as needed. 06/24/18   Frederica Kuster, PA-C  acetaminophen-codeine 120-12 MG/5ML solution Take 10 mLs by mouth every 4 (four) hours as needed for moderate pain. Patient not taking: Reported on 06/24/2018 01/05/18   Dalia Heading, PA-C  benzonatate (TESSALON) 100 MG capsule Take 1 capsule (100 mg total) by mouth every 8 (eight) hours. Patient not taking: Reported on 06/24/2018 07/30/17   Ashley Murrain, NP  cephALEXin (KEFLEX) 500 MG capsule Take 1 capsule (500 mg total) by mouth 4 (four) times daily for 10 days. 06/24/18 07/04/18  Frederica Kuster, PA-C  fluconazole (DIFLUCAN) 150 MG tablet  Take 1 tablet (150 mg total) by mouth daily for 1 dose. Take 72 hours following your emergency department visit. 06/24/18 06/25/18  Frederica Kuster, PA-C  Guaifenesin 1200 MG TB12 Take 1 tablet (1,200 mg total) by mouth 2 (two) times daily. Patient not taking: Reported on 06/24/2018 01/05/18   Dalia Heading, PA-C  ibuprofen (ADVIL,MOTRIN) 600 MG tablet Take 1 tablet (600 mg total) by mouth every 6 (six) hours as needed. 06/24/18   Kire Ferg, Bea Graff, PA-C  metroNIDAZOLE (FLAGYL) 500 MG tablet Take 1 tablet (500 mg total) by mouth 2 (two) times daily. 06/24/18   Frederica Kuster, PA-C  ondansetron (ZOFRAN ODT) 4 MG disintegrating tablet 4mg  ODT q4 hours prn nausea/vomit Patient not taking: Reported on 06/24/2018 05/12/17   Jacqlyn Larsen, PA-C  Prenatal Vit-Fe Fumarate-FA (PRENATAL COMPLETE) 14-0.4 MG TABS Take 14 mg by mouth daily. Patient not taking: Reported on 01/10/2015 06/17/14   Carman Ching, PA-C    Family History Family History  Problem Relation Age of Onset  . Diabetes Maternal Aunt   . Diabetes Maternal Grandfather     Social History Social  History   Tobacco Use  . Smoking status: Current Some Day Smoker    Types: Cigars    Last attempt to quit: 09/10/2009    Years since quitting: 8.7  . Smokeless tobacco: Never Used  Substance Use Topics  . Alcohol use: Yes    Comment: occasional  . Drug use: No     Allergies   Patient has no known allergies.   Review of Systems Review of Systems  Constitutional: Negative for chills and fever.  HENT: Negative for facial swelling and sore throat.   Respiratory: Negative for shortness of breath.   Cardiovascular: Negative for chest pain.  Gastrointestinal: Negative for abdominal pain, nausea and vomiting.  Genitourinary: Positive for dysuria, flank pain (R) and frequency. Negative for vaginal bleeding and vaginal discharge.  Musculoskeletal: Negative for back pain.  Skin: Negative for rash and wound.  Neurological: Negative for  headaches.  Psychiatric/Behavioral: The patient is not nervous/anxious.      Physical Exam Updated Vital Signs BP 111/68 (BP Location: Right Arm)   Pulse 76   Temp 99.9 F (37.7 C) (Oral)   Resp 14   Ht 5\' 3"  (1.6 m)   Wt 71.2 kg   LMP 05/23/2018 (Approximate)   SpO2 98%   BMI 27.81 kg/m   Physical Exam Vitals signs and nursing note reviewed. Exam conducted with a chaperone present.  Constitutional:      General: She is not in acute distress.    Appearance: She is well-developed. She is not diaphoretic.  HENT:     Head: Normocephalic and atraumatic.     Mouth/Throat:     Pharynx: No oropharyngeal exudate.  Eyes:     General: No scleral icterus.       Right eye: No discharge.        Left eye: No discharge.     Conjunctiva/sclera: Conjunctivae normal.     Pupils: Pupils are equal, round, and reactive to light.  Neck:     Musculoskeletal: Normal range of motion and neck supple.     Thyroid: No thyromegaly.  Cardiovascular:     Rate and Rhythm: Normal rate and regular rhythm.     Heart sounds: Normal heart sounds. No murmur. No friction rub. No gallop.   Pulmonary:     Effort: Pulmonary effort is normal. No respiratory distress.     Breath sounds: Normal breath sounds. No stridor. No wheezing or rales.  Abdominal:     General: Bowel sounds are normal. There is no distension.     Palpations: Abdomen is soft.     Tenderness: There is no abdominal tenderness. There is right CVA tenderness. There is no left CVA tenderness, guarding or rebound.  Genitourinary:    Cervix: Discharge (white/gray watery discharge) present. No cervical motion tenderness.     Adnexa:        Right: No tenderness.         Left: No tenderness.    Musculoskeletal:       Back:  Lymphadenopathy:     Cervical: No cervical adenopathy.  Skin:    General: Skin is warm and dry.     Coloration: Skin is not pale.     Findings: No rash.  Neurological:     Mental Status: She is alert.      Coordination: Coordination normal.      ED Treatments / Results  Labs (all labs ordered are listed, but only abnormal results are displayed) Labs Reviewed  WET PREP, GENITAL - Abnormal;  Notable for the following components:      Result Value   Trich, Wet Prep PRESENT (*)    Clue Cells Wet Prep HPF POC PRESENT (*)    WBC, Wet Prep HPF POC FEW (*)    All other components within normal limits  URINALYSIS, ROUTINE W REFLEX MICROSCOPIC - Abnormal; Notable for the following components:   APPearance CLOUDY (*)    Hgb urine dipstick LARGE (*)    Protein, ur 30 (*)    Leukocytes,Ua LARGE (*)    RBC / HPF >50 (*)    WBC, UA >50 (*)    Bacteria, UA FEW (*)    Trichomonas, UA PRESENT (*)    All other components within normal limits  CBC WITH DIFFERENTIAL/PLATELET - Abnormal; Notable for the following components:   WBC 13.7 (*)    Neutro Abs 12.3 (*)    Lymphs Abs 0.5 (*)    All other components within normal limits  BASIC METABOLIC PANEL - Abnormal; Notable for the following components:   Sodium 133 (*)    CO2 17 (*)    Glucose, Bld 129 (*)    All other components within normal limits  URINE CULTURE  PREGNANCY, URINE  GC/CHLAMYDIA PROBE AMP ([place]) NOT AT [facility]    EKG None  Radiology No results found.  Procedures Procedures (including critical care time)  Medications Ordered in ED Medications  metroNIDAZOLE (FLAGYL) tablet 500 mg (500 mg Oral Given 06/24/18 1844)  cephALEXin (KEFLEX) capsule 500 mg (500 mg Oral Given 06/24/18 1844)  fluconazole (DIFLUCAN) tablet 150 mg (150 mg Oral Given 06/24/18 1847)     Initial Impression / Assessment and Plan / ED Course  I have reviewed the triage vital signs and the nursing notes.  Pertinent labs & imaging results that were available during my care of the patient were reviewed by me and considered in my medical decision making (see chart for details).  Clinical Course as of Jun 24 2345  Fri Jun 24, 2018  1519 Patient with  only 1 day of symptoms, however concern for pyelonephritis considering CVA tenderness, tachycardia, and borderline temperature at 99.9 following ibuprofen.  Will check screening labs to assess kidney function and UA.   [AL]    Clinical Course User Index [AL] Frederica Kuster, PA-C       Patient presents with a 2 day history of malodorous urine, R flank pain. UA shows definite UTI with large hematuria and leukocytes, >50 RBCs and WBCs, WBC clumps, trichomonas, and yeast. Urine culture sent. Mild leukocytosis at 13.7. Renal function WNL. GC/Chlamydia pending. Wet prep shows BV. Patient is hemodynamically stable and well appearing. Patient will be treated with Keflex with coverage for pyelonephritis considering CVA tenderness, Diflucan, Flagyl. Ibuprofen and Tylenol for pain. Patient advised to follow up to establish care with an OB/GYN for annual exam and for further evaluation of recurrent UTI. Return precautions discussed. Patient understands and agrees with plan. Patient vitals stable throughout ED course and discharged in satisfactory condition.  Final Clinical Impressions(s) / ED Diagnoses   Final diagnoses:  Pyelonephritis  BV (bacterial vaginosis)  Trichimoniasis  Vaginal yeast infection    ED Discharge Orders         Ordered    cephALEXin (KEFLEX) 500 MG capsule  4 times daily     06/24/18 1850    fluconazole (DIFLUCAN) 150 MG tablet  Daily     06/24/18 1850    metroNIDAZOLE (FLAGYL) 500 MG tablet  2 times  daily     06/24/18 1850    ibuprofen (ADVIL,MOTRIN) 600 MG tablet  Every 6 hours PRN     06/24/18 1850    acetaminophen (TYLENOL) 500 MG tablet  Every 6 hours PRN     06/24/18 1850           Frederica Kuster, PA-C 06/24/18 2350    Tegeler, Gwenyth Allegra, MD 06/26/18 [phone]

## 2018-06-26 LAB — URINE CULTURE: Culture: >=100000 — AB

## 2018-06-27 ENCOUNTER — Telehealth: Payer: Self-pay

## 2018-06-27 LAB — GC/CHLAMYDIA PROBE AMP (~~LOC~~) NOT AT ARMC
Chlamydia: POSITIVE — AB
Neisseria Gonorrhea: NEGATIVE

## 2018-06-27 NOTE — Telephone Encounter (Signed)
Post ED Visit - Positive Culture Follow-up  Culture report reviewed by antimicrobial stewardship pharmacist: [place] Team []  Elenor Quinones, Pharm.D. [x]  Heide Guile, Pharm.D., BCPS AQ-ID []  Parks Neptune, Pharm.D., BCPS []  Alycia Rossetti, Pharm.D., BCPS []  [place], Pharm.D., BCPS, AAHIVP []  Legrand Como, Pharm.D., BCPS, AAHIVP []  Salome Arnt, PharmD, BCPS []  Johnnette Gourd, PharmD, BCPS []  Hughes Better, PharmD, BCPS []  Leeroy Cha, PharmD []  Laqueta Linden, PharmD, BCPS []  Albertina Parr, PharmD  [place] Team []  Leodis Sias, PharmD []  Lindell Spar, PharmD []  Royetta Asal, PharmD []  Graylin Shiver, Rph []  Rema Fendt) Glennon Mac, PharmD []  Arlyn Dunning, PharmD []  Netta Cedars, PharmD []  Dia Sitter, PharmD []  Leone Haven, PharmD []  Gretta Arab, PharmD []  Theodis Shove, PharmD []  Peggyann Juba, PharmD []  Reuel Boom, PharmD   Positive urine culture Treated with Cephalexin, organism sensitive to the same and no further patient follow-up is required at this time.  Genia Del 06/27/2018, 9:20 AM

## 2018-06-29 ENCOUNTER — Telehealth: Payer: Self-pay | Admitting: Medical

## 2018-06-29 DIAGNOSIS — A749 Chlamydial infection, unspecified: Secondary | ICD-10-CM

## 2018-06-29 MED ORDER — AZITHROMYCIN 250 MG PO TABS: 1000.0000 mg | ORAL_TABLET | Freq: Once | ORAL | 0 refills | Status: AC

## 2018-06-29 NOTE — Telephone Encounter (Addendum)
Connie Duffy tested positive for  Chlamydia. Patient was called by RN and allergies and pharmacy confirmed. Rx sent to pharmacy of choice.   Luvenia Redden, PA-C 06/29/2018 1:14 PM      ----- Message from Bjorn Loser, RN sent at 06/28/2018  1:07 PM EST ----- This patient tested positive for:  Chlamydia  She "has NKDA", I have informed the patient of her results and confirmed her pharmacy is correct in her chart. Please send Rx.   Thank you,   Bjorn Loser, RN   Results faxed to [facility] Department.

## 2018-07-14 ENCOUNTER — Encounter (HOSPITAL_COMMUNITY): Payer: Self-pay

## 2018-07-14 ENCOUNTER — Ambulatory Visit (HOSPITAL_COMMUNITY)
Admission: EM | Admit: 2018-07-14 | Discharge: 2018-07-14 | Disposition: A | Discharge status: Home / Self Care | Payer: Medicaid Other | Attending: Family Medicine | Admitting: Family Medicine

## 2018-07-14 ENCOUNTER — Other Ambulatory Visit: Payer: Self-pay

## 2018-07-14 DIAGNOSIS — Z3202 Encounter for pregnancy test, result negative: Secondary | ICD-10-CM

## 2018-07-14 DIAGNOSIS — N12 Tubulo-interstitial nephritis, not specified as acute or chronic: Secondary | ICD-10-CM

## 2018-07-14 LAB — POCT PREGNANCY, URINE: Preg Test, Ur: NEGATIVE

## 2018-07-14 LAB — POCT URINALYSIS DIP (DEVICE)
BILIRUBIN URINE: NEGATIVE
Glucose, UA: NEGATIVE mg/dL
KETONES UR: NEGATIVE mg/dL
Leukocytes,Ua: NEGATIVE
Nitrite: NEGATIVE
Protein, ur: 100 mg/dL — AB
Specific Gravity, Urine: 1.01 (ref 1.005–1.030)
Urobilinogen, UA: 2 mg/dL — ABNORMAL HIGH (ref 0.0–1.0)
pH: 6.5 (ref 5.0–8.0)

## 2018-07-14 MED ORDER — LIDOCAINE HCL (PF) 2 % IJ SOLN
INTRAMUSCULAR | Status: AC
  Filled 2018-07-14: qty 2

## 2018-07-14 MED ORDER — CEFTRIAXONE SODIUM 1 G IJ SOLR
INTRAMUSCULAR | Status: AC
  Filled 2018-07-14: qty 10

## 2018-07-14 MED ORDER — ACETAMINOPHEN 325 MG PO TABS
650.0000 mg | ORAL_TABLET | Freq: Once | ORAL | Status: AC
  Administered 2018-07-14: 650 mg via ORAL

## 2018-07-14 MED ORDER — SULFAMETHOXAZOLE-TRIMETHOPRIM 800-160 MG PO TABS: 1.0000 | ORAL_TABLET | Freq: Two times a day (BID) | ORAL | 0 refills | Status: AC

## 2018-07-14 MED ORDER — ACETAMINOPHEN 325 MG PO TABS
ORAL_TABLET | ORAL | Status: AC
  Filled 2018-07-14: qty 2

## 2018-07-14 MED ORDER — CEFTRIAXONE SODIUM 1 G IJ SOLR
1.0000 g | Freq: Once | INTRAMUSCULAR | Status: AC
  Administered 2018-07-14: 1 g via INTRAMUSCULAR

## 2018-07-14 NOTE — ED Notes (Signed)
Pt admits to only taking maybe 5 days worth of her antibiotics.

## 2018-07-14 NOTE — ED Provider Notes (Addendum)
[facility]    CSN: [id number] Arrival date & time: 07/14/18  1345     History   Chief Complaint Chief Complaint  Patient presents with  . kidney infection    HPI Connie Duffy is a 28 y.o. female.   Pt is a 28 year old female that presents with urinary frequency, dysuria and urinary odor.  Symptoms have been constant and worsening over the past couple days.  She has not take anything for her symptoms.  She was seen approximate 3 weeks ago in the emergency department and treated for pyelonephritis and multiple STDs.  She is currently having right flank pain and fever.  She is denying any vaginal discharge, itching or irritation.  Denies any sexual contact since recently treated.  Admitting to not finishing the full course of antibiotics that she was given for the kidney infection.  No nausea, vomiting, diarrhea.  ROS per HPI      Past Medical History:  Diagnosis Date  . Abnormal Pap smear   . Sickle cell trait ([place])     There are no active problems to display for this patient.   Past Surgical History:  Procedure Laterality Date  . COLPOSCOPY      OB History    Gravida  2   Para  2   Term  2   Preterm      AB      Living  2     SAB      TAB      Ectopic      Multiple      Live Births  2            Home Medications    Prior to Admission medications   Medication Sig Start Date End Date Taking? Authorizing Provider  acetaminophen (TYLENOL) 500 MG tablet Take 1 tablet (500 mg total) by mouth every 6 (six) hours as needed. 06/24/18   Frederica Kuster, PA-C  acetaminophen-codeine 120-12 MG/5ML solution Take 10 mLs by mouth every 4 (four) hours as needed for moderate pain. Patient not taking: Reported on 06/24/2018 01/05/18   Dalia Heading, PA-C  benzonatate (TESSALON) 100 MG capsule Take 1 capsule (100 mg total) by mouth every 8 (eight) hours. Patient not taking: Reported on 06/24/2018 07/30/17   Ashley Murrain, NP  Guaifenesin  1200 MG TB12 Take 1 tablet (1,200 mg total) by mouth 2 (two) times daily. Patient not taking: Reported on 06/24/2018 01/05/18   Dalia Heading, PA-C  ibuprofen (ADVIL,MOTRIN) 600 MG tablet Take 1 tablet (600 mg total) by mouth every 6 (six) hours as needed. 06/24/18   Law, Bea Graff, PA-C  metroNIDAZOLE (FLAGYL) 500 MG tablet Take 1 tablet (500 mg total) by mouth 2 (two) times daily. 06/24/18   Frederica Kuster, PA-C  ondansetron (ZOFRAN ODT) 4 MG disintegrating tablet 4mg  ODT q4 hours prn nausea/vomit Patient not taking: Reported on 06/24/2018 05/12/17   Jacqlyn Larsen, PA-C  Prenatal Vit-Fe Fumarate-FA (PRENATAL COMPLETE) 14-0.4 MG TABS Take 14 mg by mouth daily. Patient not taking: Reported on 01/10/2015 06/17/14   Carman Ching, PA-C  sulfamethoxazole-trimethoprim (BACTRIM DS,SEPTRA DS) 800-160 MG tablet Take 1 tablet by mouth 2 (two) times daily for 7 days. 07/14/18 07/21/18  Orvan July, NP    Family History Family History  Problem Relation Age of Onset  . Diabetes Maternal Aunt   . Diabetes Maternal Grandfather     Social History Social History   Tobacco Use  .  Smoking status: Current Some Day Smoker    Types: Cigars    Last attempt to quit: 09/10/2009    Years since quitting: 8.8  . Smokeless tobacco: Never Used  Substance Use Topics  . Alcohol use: Yes    Comment: occasional  . Drug use: No     Allergies   Patient has no known allergies.   Review of Systems Review of Systems  Constitutional: Positive for chills and fever.  Gastrointestinal: Positive for abdominal pain. Negative for vomiting.  Genitourinary: Positive for dysuria, frequency and urgency. Negative for pelvic pain, vaginal bleeding and vaginal discharge.     Physical Exam Triage Vital Signs ED Triage Vitals  Enc Vitals Group     BP 07/14/18 1427 119/67     Pulse --      Resp 07/14/18 1427 16     Temp 07/14/18 1427 100.1 F (37.8 C)     Temp src --      SpO2 07/14/18 1427 98 %     Weight  07/14/18 1425 160 lb (72.6 kg)     Height --      Head Circumference --      Peak Flow --      Pain Score 07/14/18 1425 8     Pain Loc --      Pain Edu? --      Excl. in [place]? --    No data found.  Updated Vital Signs BP 119/67 (BP Location: Right Arm)   Pulse (!) 103   Temp (!) 101.6 F (38.7 C) (Oral)   Resp 16   Wt 160 lb (72.6 kg)   LMP 05/26/2018   SpO2 98%   BMI 28.34 kg/m   Visual Acuity Right Eye Distance:   Left Eye Distance:   Bilateral Distance:    Right Eye Near:   Left Eye Near:    Bilateral Near:     Physical Exam Constitutional:      Appearance: Normal appearance. She is ill-appearing.     Comments: Appears in pain   HENT:     Head: Normocephalic and atraumatic.     Nose: Nose normal.  Eyes:     Conjunctiva/sclera: Conjunctivae normal.  Neck:     Musculoskeletal: Normal range of motion.  Pulmonary:     Effort: Pulmonary effort is normal.  Abdominal:     Tenderness: There is abdominal tenderness in the suprapubic area. There is right CVA tenderness.  Musculoskeletal: Normal range of motion.  Skin:    General: Skin is warm and dry.  Neurological:     Mental Status: She is alert.  Psychiatric:        Mood and Affect: Mood normal.      UC Treatments / Results  Labs (all labs ordered are listed, but only abnormal results are displayed) Labs Reviewed  POCT URINALYSIS DIP (DEVICE) - Abnormal; Notable for the following components:      Result Value   Hgb urine dipstick MODERATE (*)    Protein, ur 100 (*)    Urobilinogen, UA 2.0 (*)    All other components within normal limits  URINE CULTURE  POC URINE PREG, ED  POCT PREGNANCY, URINE    EKG None  Radiology No results found.  Procedures Procedures (including critical care time)  Medications Ordered in UC Medications  cefTRIAXone (ROCEPHIN) injection 1 g (1 g Intramuscular Given 07/14/18 1538)  acetaminophen (TYLENOL) tablet 650 mg (650 mg Oral Given 07/14/18 1537)    Initial  Impression /  Assessment and Plan / UC Course  I have reviewed the triage vital signs and the nursing notes.  Pertinent labs & imaging results that were available during my care of the patient were reviewed by me and considered in my medical decision making (see chart for details).     Patient is a 28 year old female with right flank pain, fever, dysuria, malodorous urine. Present and worsening over last 3 days.  Recently treated for pyelonephritis a few weeks back in the hospital.  She did not finish her full course of antibiotics. She does admit to finishing all of the medication for STDs and vaginal infection. Denies any new exposures since being treated.  Denies any vaginal discharge No concerns for PID Urine with blood, leuks We will send for culture We will go ahead and treat for pyelonephritis based on symptoms, urine and history Rocephin injection given here in clinic along with Tylenol for fever Sent Bactrim to the pharmacy for her to start and complete Instructed to drink plenty of water Strict precautions and instructions that if her symptoms worsen despite treatment she will need to go to the hospital Patient understanding and agrees  Final Clinical Impressions(s) / UC Diagnoses   Final diagnoses:  Pyelonephritis     Discharge Instructions     We are treating you for a kidney infection We are giving you an injection of antibiotics here in clinic Tylenol for your fever I am sending a prescription of more antibiotics to the pharmacy for you to complete Drink plenty of water If your symptoms worsen please go to the hospital    ED Prescriptions    Medication Sig Dispense Auth. Provider   sulfamethoxazole-trimethoprim (BACTRIM DS,SEPTRA DS) 800-160 MG tablet Take 1 tablet by mouth 2 (two) times daily for 7 days. 14 tablet Loura Halt A, NP     Controlled Substance Prescriptions [place] Controlled Substance Registry consulted? Not Applicable        Orvan July,  NP 07/14/18 [phone]

## 2018-07-14 NOTE — ED Triage Notes (Signed)
Pt cc states she has had a kidney infection 2 or 3 weeks ago. Pt cc she's voiding a lot and it has a odor.

## 2018-07-14 NOTE — Discharge Instructions (Addendum)
We are treating you for a kidney infection We are giving you an injection of antibiotics here in clinic Tylenol for your fever I am sending a prescription of more antibiotics to the pharmacy for you to complete Drink plenty of water If your symptoms worsen please go to the hospital

## 2018-07-14 NOTE — ED Notes (Signed)
Patient is unable to void at this time 

## 2018-07-15 LAB — URINE CULTURE: Culture: NO GROWTH

## 2018-12-07 ENCOUNTER — Other Ambulatory Visit: Payer: Self-pay

## 2018-12-07 DIAGNOSIS — Z20822 Contact with and (suspected) exposure to covid-19: Secondary | ICD-10-CM

## 2018-12-08 LAB — NOVEL CORONAVIRUS, NAA: SARS-CoV-2, NAA: NOT DETECTED

## 2018-12-12 ENCOUNTER — Other Ambulatory Visit: Payer: Self-pay

## 2019-09-24 IMAGING — CR DG CHEST 2V
2 series · 2 of 2 positions shown · non-contrast
Comparison: 07/30/2017

CLINICAL DATA: Cough, congestion

EXAM:
CHEST - 2 VIEW

[chest pa]
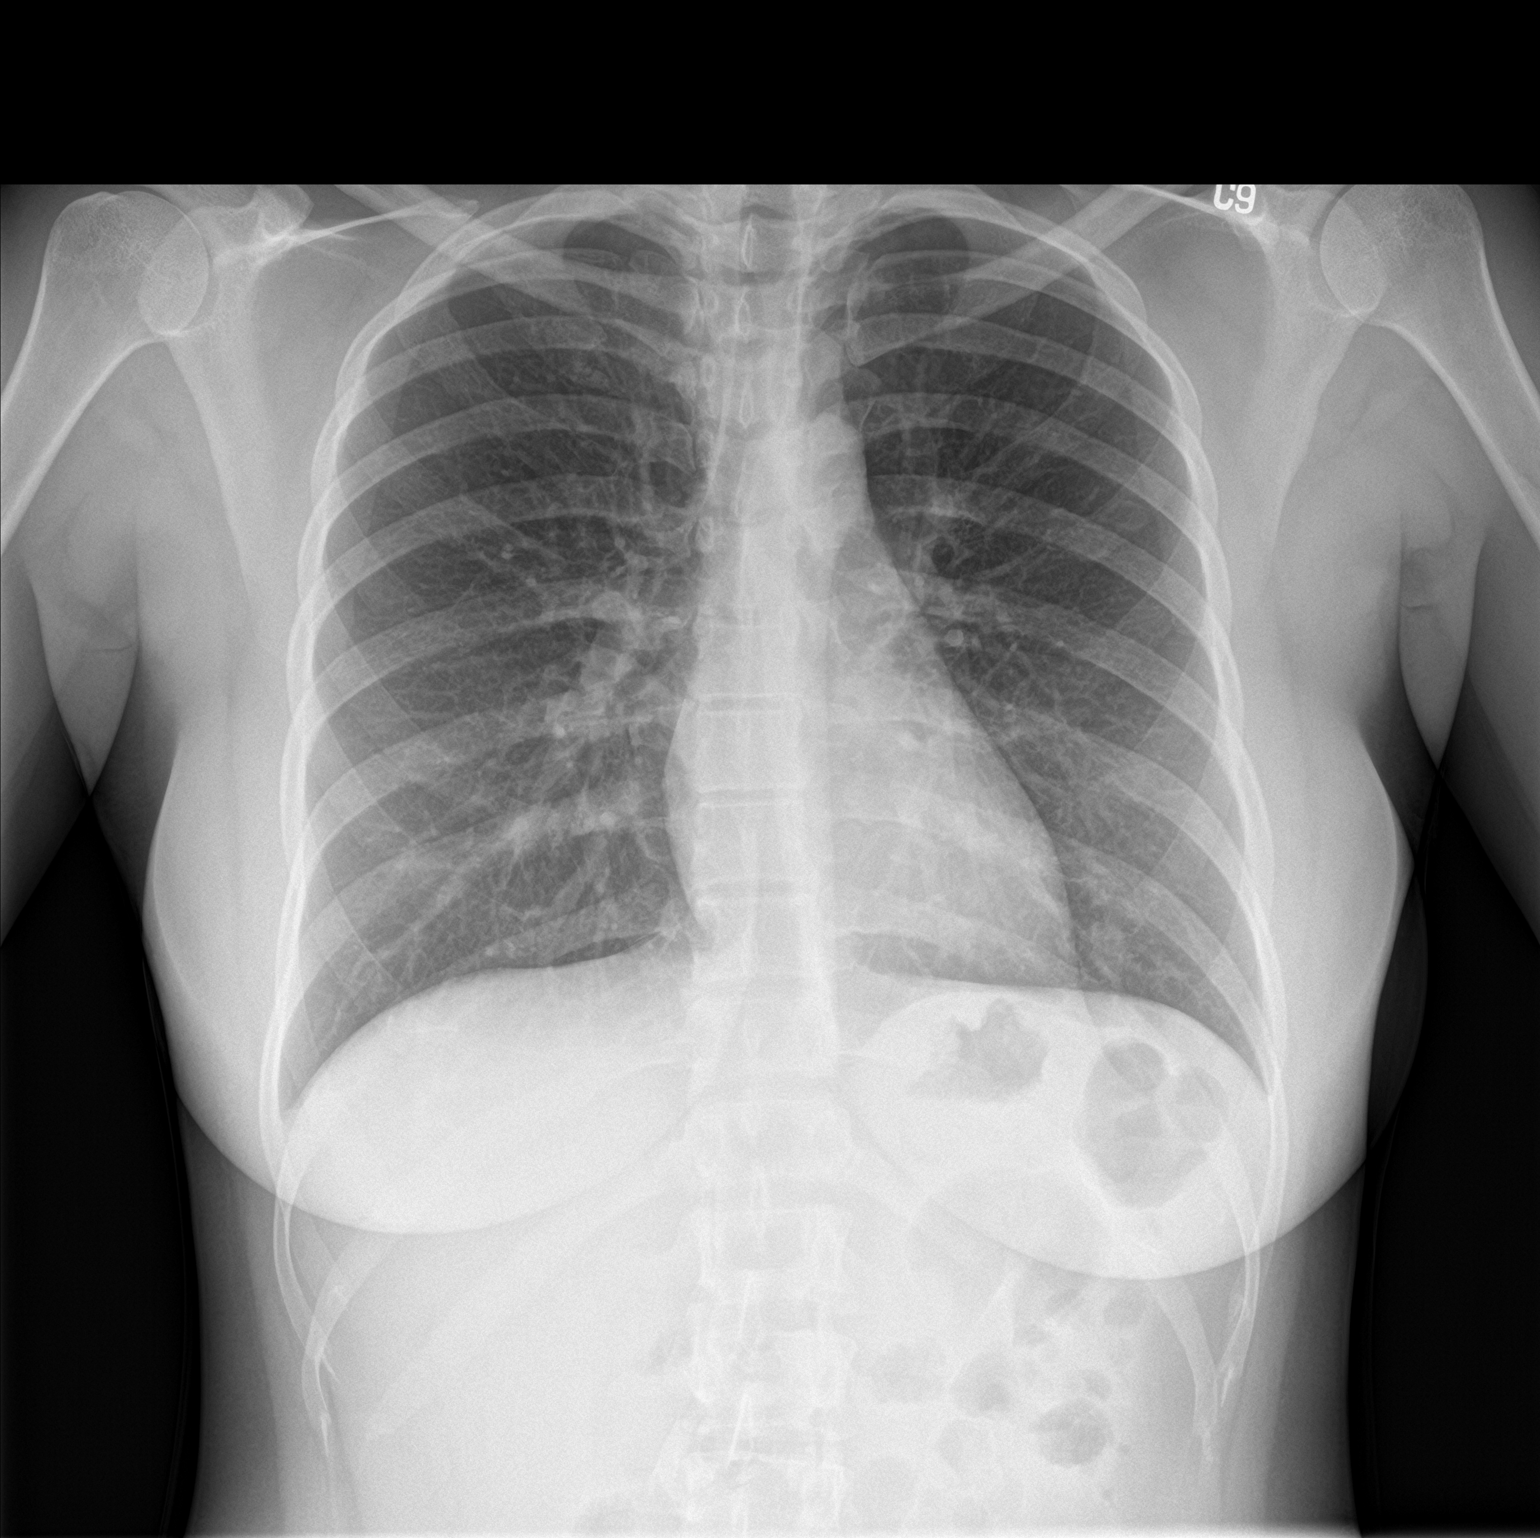

[chest lat]
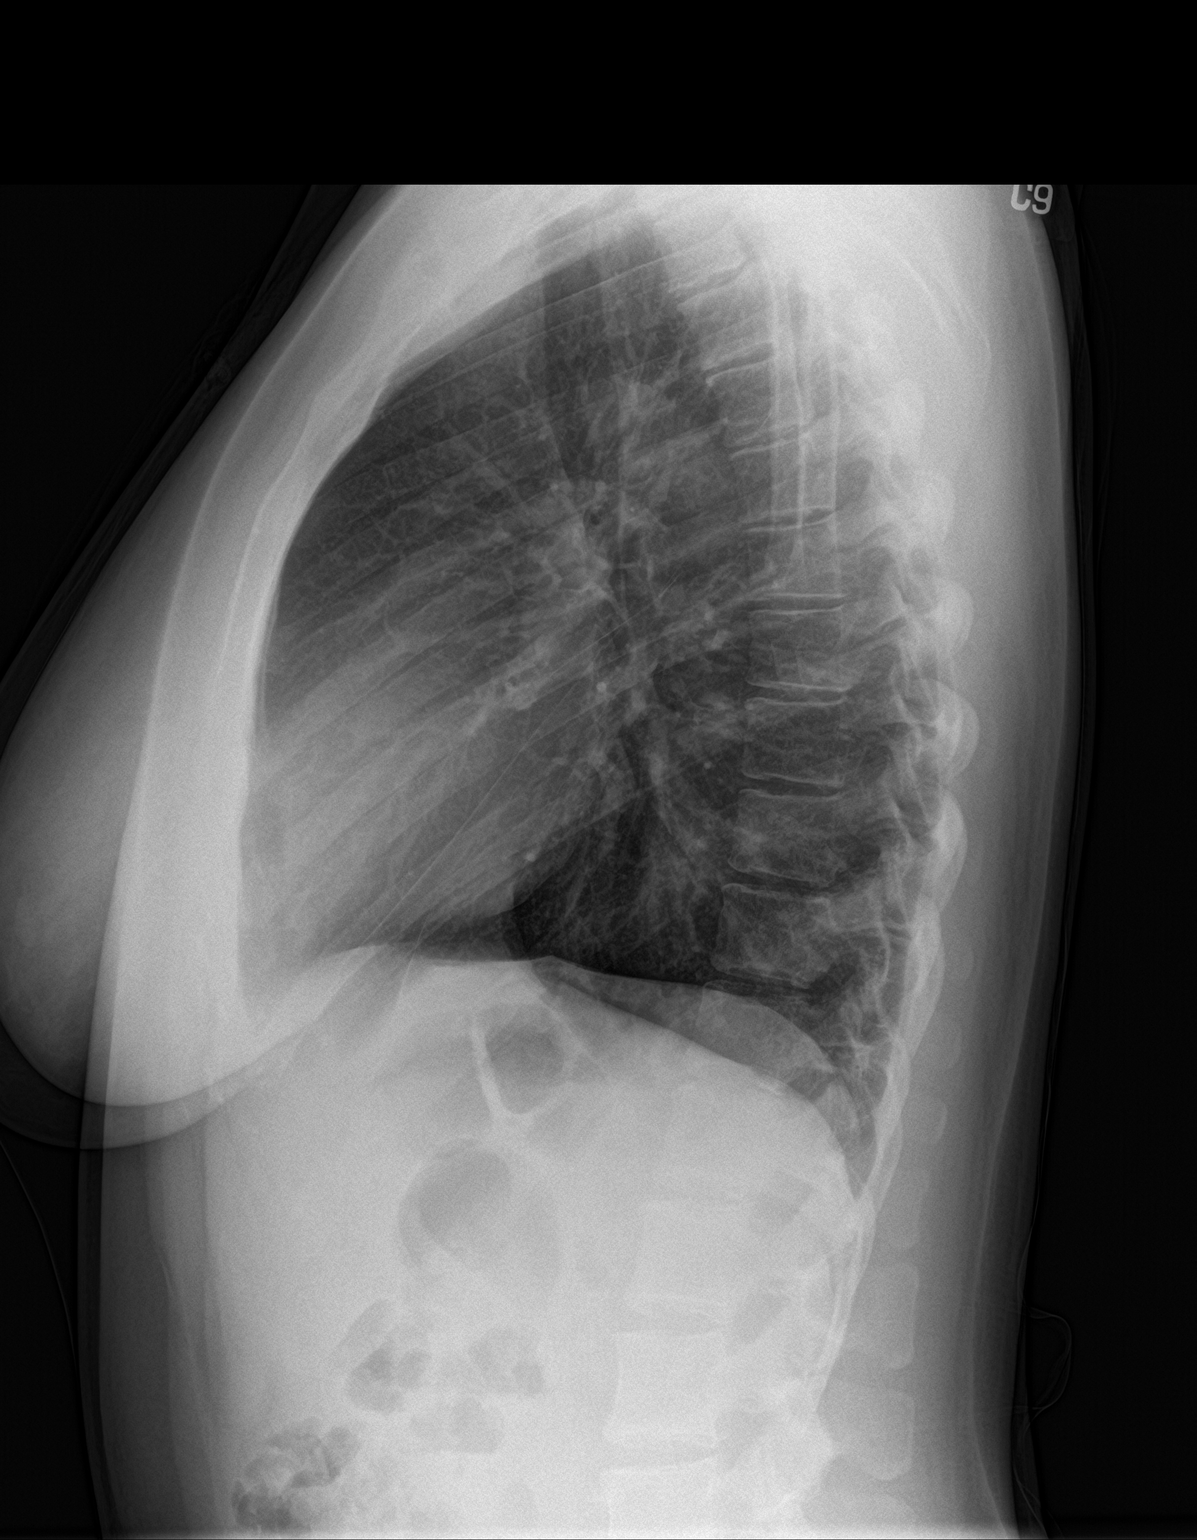

[2 of 2 positions shown; findings below may reference images not displayed]

FINDINGS: Slightly increased markings anteriorly on the lateral view not well
seen on the frontal view but may be within the lingula. Question
early infiltrate/pneumonia. No effusions. Heart is normal size. No
acute bony abnormality.
IMPRESSION: Slight increased markings anteriorly on the lateral view, likely
within the lingula. Cannot exclude early pneumonia/bronchopneumonia.

## 2019-12-04 ENCOUNTER — Ambulatory Visit (HOSPITAL_COMMUNITY)
Admission: EM | Admit: 2019-12-04 | Discharge: 2019-12-04 | Disposition: A | Discharge status: Home / Self Care | Payer: 59 | Attending: Family Medicine | Admitting: Family Medicine

## 2019-12-04 ENCOUNTER — Encounter (HOSPITAL_COMMUNITY): Payer: Self-pay

## 2019-12-04 ENCOUNTER — Other Ambulatory Visit: Payer: Self-pay

## 2019-12-04 DIAGNOSIS — D573 Sickle-cell trait: Secondary | ICD-10-CM | POA: Diagnosis not present

## 2019-12-04 DIAGNOSIS — J029 Acute pharyngitis, unspecified: Secondary | ICD-10-CM

## 2019-12-04 DIAGNOSIS — Z87891 Personal history of nicotine dependence: Secondary | ICD-10-CM | POA: Insufficient documentation

## 2019-12-04 DIAGNOSIS — U071 COVID-19: Secondary | ICD-10-CM | POA: Insufficient documentation

## 2019-12-04 DIAGNOSIS — J069 Acute upper respiratory infection, unspecified: Secondary | ICD-10-CM

## 2019-12-04 LAB — POCT RAPID STREP A, ED / UC: Streptococcus, Group A Screen (Direct): NEGATIVE

## 2019-12-04 LAB — SARS CORONAVIRUS 2 (TAT 6-24 HRS): SARS Coronavirus 2: POSITIVE — AB

## 2019-12-04 NOTE — Discharge Instructions (Addendum)
Rapid strep test negative.  Covid swab pending.  Over-the-counter medicines as needed for your symptoms. Follow up as needed for continued or worsening symptoms

## 2019-12-04 NOTE — ED Triage Notes (Signed)
Pt c/o sore throatx2 days. Pt denies dysphagia. Pt c/o non productive cough. Pt states was around COVID + person.

## 2019-12-04 NOTE — ED Provider Notes (Signed)
Connie Duffy    CSN: [id number] Arrival date & time: 12/04/19  6144      History   Chief Complaint Chief Complaint  Patient presents with  . Sore Throat    HPI Connie Duffy is a 29 y.o. female.   Patient is a 29 year old female presents today with a sore throat and nonproductive cough for 2 days.  Symptoms have been constant.  Positive Covid exposures.  No fevers, chills, body aches.     Past Medical History:  Diagnosis Date  . Abnormal Pap smear   . Sickle cell trait ([place])     There are no problems to display for this patient.   Past Surgical History:  Procedure Laterality Date  . COLPOSCOPY      OB History    Gravida  2   Para  2   Term  2   Preterm      AB      Living  2     SAB      TAB      Ectopic      Multiple      Live Births  2            Home Medications    Prior to Admission medications   Not on File    Family History Family History  Problem Relation Age of Onset  . Diabetes Maternal Aunt   . Diabetes Maternal Grandfather     Social History Social History   Tobacco Use  . Smoking status: Former Smoker    Types: Cigars    Quit date: 09/10/2009    Years since quitting: 10.2  . Smokeless tobacco: Never Used  Substance Use Topics  . Alcohol use: Yes    Comment: occasional  . Drug use: No     Allergies   Patient has no known allergies.   Review of Systems Review of Systems   Physical Exam Triage Vital Signs ED Triage Vitals  Enc Vitals Group     BP 12/04/19 0958 112/60     Pulse Rate 12/04/19 0958 71     Resp 12/04/19 0958 16     Temp 12/04/19 0958 98.5 F (36.9 C)     Temp Source 12/04/19 0958 Oral     SpO2 --      Weight 12/04/19 0959 150 lb (68 kg)     Height 12/04/19 0959 5\' 3"  (1.6 m)     Head Circumference --      Peak Flow --      Pain Score 12/04/19 0959 4     Pain Loc --      Pain Edu? --      Excl. in [place]? --    No data found.  Updated Vital Signs BP 112/60    Pulse 71   Temp 98.5 F (36.9 C) (Oral)   Resp 16   Ht 5\' 3"  (1.6 m)   Wt 150 lb (68 kg)   BMI 26.57 kg/m   Visual Acuity Right Eye Distance:   Left Eye Distance:   Bilateral Distance:    Right Eye Near:   Left Eye Near:    Bilateral Near:     Physical Exam Vitals and nursing note reviewed.  Constitutional:      General: She is not in acute distress.    Appearance: Normal appearance. She is not ill-appearing, toxic-appearing or diaphoretic.  HENT:     Head: Normocephalic.     Nose: Nose normal.  Mouth/Throat:     Pharynx: Oropharynx is clear.  Eyes:     Conjunctiva/sclera: Conjunctivae normal.  Pulmonary:     Effort: Pulmonary effort is normal.  Musculoskeletal:        General: Normal range of motion.     Cervical back: Normal range of motion.  Skin:    General: Skin is warm and dry.     Findings: No rash.  Neurological:     Mental Status: She is alert.  Psychiatric:        Mood and Affect: Mood normal.      UC Treatments / Results  Labs (all labs ordered are listed, but only abnormal results are displayed) Labs Reviewed  SARS CORONAVIRUS 2 (TAT 6-24 HRS)  CULTURE, GROUP A STREP [facility])  POCT RAPID STREP A, ED / UC    EKG   Radiology No results found.  Procedures Procedures (including critical care time)  Medications Ordered in UC Medications - No data to display  Initial Impression / Assessment and Plan / UC Course  I have reviewed the triage vital signs and the nursing notes.  Pertinent labs & imaging results that were available during my care of the patient were reviewed by me and considered in my medical decision making (see chart for details).     Viral URI with cough Rapid strep test negative.  Send for culture.  Covid swab pending. Over-the-counter medicines as needed for symptoms. Follow up as needed for continued or worsening symptoms  Final Clinical Impressions(s) / UC Diagnoses   Final diagnoses:  Viral URI with cough      Discharge Instructions     Rapid strep test negative.  Covid swab pending.  Over-the-counter medicines as needed for your symptoms. Follow up as needed for continued or worsening symptoms     ED Prescriptions    None     PDMP not reviewed this encounter.   Loura Halt A, NP 12/04/19 1057

## 2019-12-05 ENCOUNTER — Telehealth: Payer: Self-pay | Admitting: Nurse Practitioner

## 2019-12-05 NOTE — Telephone Encounter (Signed)
Called to discuss with Connie Duffy about Covid symptoms and the use of casirivimab/imdevimab, a combination monoclonal antibody infusion for those with mild to moderate Covid symptoms and at a high risk of hospitalization.     Pt is qualified for this infusion at the [facility] infusion center due to co-morbid conditions (BMI >25 and sickle cell). Per ED notation symptom onset 12/02/19.  Unable to reach. Voicemail left.   Alda Lea, AGPCNP-BC

## 2019-12-06 LAB — CULTURE, GROUP A STREP (THRC)

## 2020-03-15 ENCOUNTER — Other Ambulatory Visit: Payer: Self-pay

## 2020-03-15 ENCOUNTER — Ambulatory Visit (HOSPITAL_COMMUNITY)
Admission: EM | Admit: 2020-03-15 | Discharge: 2020-03-15 | Disposition: A | Discharge status: Home / Self Care | Payer: 59 | Attending: Family Medicine | Admitting: Family Medicine

## 2020-03-15 ENCOUNTER — Encounter (HOSPITAL_COMMUNITY): Payer: Self-pay | Admitting: Emergency Medicine

## 2020-03-15 DIAGNOSIS — J069 Acute upper respiratory infection, unspecified: Secondary | ICD-10-CM | POA: Diagnosis present

## 2020-03-15 DIAGNOSIS — Z87891 Personal history of nicotine dependence: Secondary | ICD-10-CM | POA: Insufficient documentation

## 2020-03-15 DIAGNOSIS — Z20822 Contact with and (suspected) exposure to covid-19: Secondary | ICD-10-CM | POA: Diagnosis not present

## 2020-03-15 DIAGNOSIS — R0981 Nasal congestion: Secondary | ICD-10-CM

## 2020-03-15 DIAGNOSIS — R110 Nausea: Secondary | ICD-10-CM | POA: Diagnosis present

## 2020-03-15 DIAGNOSIS — Z3201 Encounter for pregnancy test, result positive: Secondary | ICD-10-CM | POA: Diagnosis not present

## 2020-03-15 DIAGNOSIS — R059 Cough, unspecified: Secondary | ICD-10-CM

## 2020-03-15 LAB — POC URINE PREG, ED: Preg Test, Ur: POSITIVE — AB

## 2020-03-15 LAB — POCT URINALYSIS DIPSTICK, ED / UC
Bilirubin Urine: NEGATIVE
Glucose, UA: NEGATIVE mg/dL
Ketones, ur: NEGATIVE mg/dL
Nitrite: NEGATIVE
Protein, ur: 30 mg/dL — AB
Specific Gravity, Urine: 1.015 (ref 1.005–1.030)
Urobilinogen, UA: 1 mg/dL (ref 0.0–1.0)
pH: 7 (ref 5.0–8.0)

## 2020-03-15 LAB — SARS CORONAVIRUS 2 (TAT 6-24 HRS): SARS Coronavirus 2: NEGATIVE

## 2020-03-15 MED ORDER — PROMETHAZINE-DM 6.25-15 MG/5ML PO SYRP: 5.0000 mL | ORAL_SOLUTION | Freq: Four times a day (QID) | ORAL | 0 refills | Status: DC | PRN

## 2020-03-15 MED ORDER — ONDANSETRON 4 MG PO TBDP: 4.0000 mg | ORAL_TABLET | Freq: Three times a day (TID) | ORAL | 0 refills | Status: DC | PRN

## 2020-03-15 NOTE — ED Provider Notes (Signed)
[place]    CSN: [id number] Arrival date & time: 03/15/20  1416      History   Chief Complaint Chief Complaint  Patient presents with  . Cough  . Nasal Congestion  . Nausea  . Headache    HPI Connie Duffy is a 29 y.o. female.   Patient presenting today with 3 day hx of cough, congestion, nausea and vomiting, headache. Denies fever, chills, CP, SOB, diarrhea, rashes. Daughter has been sick with a cold recently, otherwise no sick contacts known. No hx of asthma, allergic rhinitis. Took one dose of cold medication this morning without relief.      Past Medical History:  Diagnosis Date  . Abnormal Pap smear   . Sickle cell trait ([place])     There are no problems to display for this patient.   Past Surgical History:  Procedure Laterality Date  . COLPOSCOPY      OB History    Gravida  2   Para  2   Term  2   Preterm      AB      Living  2     SAB      TAB      Ectopic      Multiple      Live Births  2            Home Medications    Prior to Admission medications   Not on File    Family History Family History  Problem Relation Age of Onset  . Diabetes Maternal Aunt   . Diabetes Maternal Grandfather     Social History Social History   Tobacco Use  . Smoking status: Former Smoker    Types: Cigars    Quit date: 09/10/2009    Years since quitting: 10.5  . Smokeless tobacco: Never Used  Substance Use Topics  . Alcohol use: Yes    Comment: occasional  . Drug use: No     Allergies   Patient has no known allergies.   Review of Systems Review of Systems PER HPI    Physical Exam Triage Vital Signs ED Triage Vitals  Enc Vitals Group     BP 03/15/20 1504 (!) 126/59     Pulse Rate 03/15/20 1504 96     Resp 03/15/20 1504 18     Temp 03/15/20 1504 98 F (36.7 C)     Temp Source 03/15/20 1504 Oral     SpO2 03/15/20 1504 100 %     Weight --      Height --      Head Circumference --      Peak Flow --        Pain Score 03/15/20 1501 7     Pain Loc --      Pain Edu? --      Excl. in [place]? --    No data found.  Updated Vital Signs BP (!) 126/59 (BP Location: Left Arm)   Pulse 96   Temp 98 F (36.7 C) (Oral)   Resp 18   LMP 01/07/2020   SpO2 100%   Visual Acuity Right Eye Distance:   Left Eye Distance:   Bilateral Distance:    Right Eye Near:   Left Eye Near:    Bilateral Near:     Physical Exam Vitals and nursing note reviewed.  Constitutional:      Appearance: Normal appearance. She is not ill-appearing.  HENT:     Head:  Atraumatic.     Right Ear: Tympanic membrane normal.     Left Ear: Tympanic membrane normal.     Nose: Rhinorrhea present.     Mouth/Throat:     Mouth: Mucous membranes are moist.     Pharynx: Posterior oropharyngeal erythema present.  Eyes:     Extraocular Movements: Extraocular movements intact.     Conjunctiva/sclera: Conjunctivae normal.  Cardiovascular:     Rate and Rhythm: Normal rate and regular rhythm.     Heart sounds: Normal heart sounds.  Pulmonary:     Effort: Pulmonary effort is normal.     Breath sounds: Normal breath sounds. No wheezing or rales.  Abdominal:     General: Bowel sounds are normal. There is no distension.     Palpations: Abdomen is soft.     Tenderness: There is no abdominal tenderness. There is no right CVA tenderness, left CVA tenderness or guarding.  Musculoskeletal:        General: Normal range of motion.     Cervical back: Normal range of motion and neck supple.  Skin:    General: Skin is warm and dry.  Neurological:     Mental Status: She is alert and oriented to person, place, and time.  Psychiatric:        Mood and Affect: Mood normal.        Thought Content: Thought content normal.        Judgment: Judgment normal.     UC Treatments / Results  Labs (all labs ordered are listed, but only abnormal results are displayed) Labs Reviewed  POC URINE PREG, ED - Abnormal; Notable for the following  components:      Result Value   Preg Test, Ur POSITIVE (*)    All other components within normal limits  POCT URINALYSIS DIPSTICK, ED / UC - Abnormal; Notable for the following components:   Hgb urine dipstick MODERATE (*)    Protein, ur 30 (*)    Leukocytes,Ua SMALL (*)    All other components within normal limits  SARS CORONAVIRUS 2 (TAT 6-24 HRS)    EKG   Radiology No results found.  Procedures Procedures (including critical care time)  Medications Ordered in UC Medications - No data to display  Initial Impression / Assessment and Plan / UC Course  I have reviewed the triage vital signs and the nursing notes.  Pertinent labs & imaging results that were available during my care of the patient were reviewed by me and considered in my medical decision making (see chart for details).     COVID pcr pending, work note given while awaiting these results. Initially sent in zofran for nausea and phenergan DM for cough, but when asked at time of discharge about pregnancy status upon reviewing LMP she endorsed being unsure if pregnant. Urine preg then performed which was positive. The urine was cloudy so U/A also performed which did not show an obvious infection though showed some hgb and small amount of leuks. She was notified regarding these results and we discussed prenatal lifestyle recommendations and making an OBGYN appt first thing next week to check in on these things. Both scripts cancelled at pharmacy given her pregnancy status and we discussed pregnancy safe over the counter medications for her sxs. Return precautions reviewed.   Final Clinical Impressions(s) / UC Diagnoses   Final diagnoses:  Viral URI with cough  Nausea  Positive urine pregnancy test   Discharge Instructions   None    ED  Prescriptions    Medication Sig Dispense Auth. Provider   ondansetron (ZOFRAN ODT) 4 MG disintegrating tablet  (Status: Discontinued) Take 1 tablet (4 mg total) by mouth every 8  (eight) hours as needed for nausea or vomiting. 21 tablet Volney American, [place]   promethazine-dextromethorphan (PROMETHAZINE-DM) 6.25-15 MG/5ML syrup  (Status: Discontinued) Take 5 mLs by mouth 4 (four) times daily as needed for cough. 100 mL Volney American, [place]     PDMP not reviewed this encounter.   Volney American, [place] 03/15/20 1728

## 2020-03-15 NOTE — ED Triage Notes (Signed)
Pt c/o nausea, headache, nasal congestion, sore throat, hoarseness, and bodyaches x 3 days. Pt states she took some otc cold medicine this morning. Pt states it is a dry cough and she is having chest pain after coughing.

## 2020-04-11 ENCOUNTER — Other Ambulatory Visit: Payer: Self-pay

## 2020-04-11 ENCOUNTER — Inpatient Hospital Stay (HOSPITAL_COMMUNITY): Payer: 59

## 2020-04-11 ENCOUNTER — Encounter (HOSPITAL_COMMUNITY): Payer: Self-pay | Admitting: Obstetrics and Gynecology

## 2020-04-11 ENCOUNTER — Inpatient Hospital Stay (HOSPITAL_COMMUNITY)
Admission: AD | Admit: 2020-04-11 | Discharge: 2020-04-11 | Disposition: A | Discharge status: Home / Self Care | Payer: 59 | Attending: Obstetrics and Gynecology | Admitting: Obstetrics and Gynecology

## 2020-04-11 DIAGNOSIS — R58 Hemorrhage, not elsewhere classified: Secondary | ICD-10-CM

## 2020-04-11 DIAGNOSIS — O26891 Other specified pregnancy related conditions, first trimester: Secondary | ICD-10-CM | POA: Diagnosis not present

## 2020-04-11 DIAGNOSIS — F17290 Nicotine dependence, other tobacco product, uncomplicated: Secondary | ICD-10-CM | POA: Insufficient documentation

## 2020-04-11 DIAGNOSIS — Z3491 Encounter for supervision of normal pregnancy, unspecified, first trimester: Secondary | ICD-10-CM

## 2020-04-11 DIAGNOSIS — D270 Benign neoplasm of right ovary: Secondary | ICD-10-CM

## 2020-04-11 DIAGNOSIS — R109 Unspecified abdominal pain: Secondary | ICD-10-CM | POA: Insufficient documentation

## 2020-04-11 DIAGNOSIS — Z3A12 12 weeks gestation of pregnancy: Secondary | ICD-10-CM | POA: Diagnosis not present

## 2020-04-11 DIAGNOSIS — O2342 Unspecified infection of urinary tract in pregnancy, second trimester: Secondary | ICD-10-CM | POA: Insufficient documentation

## 2020-04-11 DIAGNOSIS — O2341 Unspecified infection of urinary tract in pregnancy, first trimester: Secondary | ICD-10-CM

## 2020-04-11 DIAGNOSIS — O99331 Smoking (tobacco) complicating pregnancy, first trimester: Secondary | ICD-10-CM | POA: Diagnosis not present

## 2020-04-11 DIAGNOSIS — O3481 Maternal care for other abnormalities of pelvic organs, first trimester: Secondary | ICD-10-CM | POA: Diagnosis not present

## 2020-04-11 DIAGNOSIS — O4691 Antepartum hemorrhage, unspecified, first trimester: Secondary | ICD-10-CM | POA: Diagnosis not present

## 2020-04-11 DIAGNOSIS — O209 Hemorrhage in early pregnancy, unspecified: Secondary | ICD-10-CM | POA: Diagnosis not present

## 2020-04-11 LAB — CBC
HCT: 33.4 % — ABNORMAL LOW (ref 36.0–46.0)
Hemoglobin: 12.2 g/dL (ref 12.0–15.0)
MCH: 32.1 pg (ref 26.0–34.0)
MCHC: 36.5 g/dL — ABNORMAL HIGH (ref 30.0–36.0)
MCV: 87.9 fL (ref 80.0–100.0)
Platelets: 304 10*3/uL (ref 150–400)
RBC: 3.8 MIL/uL — ABNORMAL LOW (ref 3.87–5.11)
RDW: 11.8 % (ref 11.5–15.5)
WBC: 10 10*3/uL (ref 4.0–10.5)
nRBC: 0 % (ref 0.0–0.2)

## 2020-04-11 LAB — WET PREP, GENITAL
Clue Cells Wet Prep HPF POC: NONE SEEN
Sperm: NONE SEEN
Trich, Wet Prep: NONE SEEN
Yeast Wet Prep HPF POC: NONE SEEN

## 2020-04-11 LAB — URINALYSIS, ROUTINE W REFLEX MICROSCOPIC
Bilirubin Urine: NEGATIVE
Glucose, UA: NEGATIVE mg/dL
Ketones, ur: NEGATIVE mg/dL
Nitrite: POSITIVE — AB
Protein, ur: NEGATIVE mg/dL
Specific Gravity, Urine: 1.009 (ref 1.005–1.030)
WBC, UA: >50 WBC/hpf — ABNORMAL HIGH (ref 0–5)
pH: 6 (ref 5.0–8.0)

## 2020-04-11 LAB — ABO/RH: ABO/RH(D): B POS

## 2020-04-11 LAB — HCG, QUANTITATIVE, PREGNANCY: hCG, Beta Chain, Quant, S: 82432 m[IU]/mL — ABNORMAL HIGH (ref <5)

## 2020-04-11 MED ORDER — CEFADROXIL 500 MG PO CAPS: 500.0000 mg | ORAL_CAPSULE | Freq: Two times a day (BID) | ORAL | 0 refills | Status: DC

## 2020-04-11 NOTE — MAU Note (Signed)
.   Connie Duffy is a 29 y.o. at [redacted]w[redacted]d here in MAU reporting: she has had vaginal bleeding on and off since Nov 16th. Last intercourse yesterday. Pt denies pain at present but has had abdominal cramping on and off. LMP: 01/14/2020 Onset of complaint: 03/12/2020 Pain score: 0 Vitals:   04/11/20 1228  BP: 126/69  Pulse: (!) 101  Resp: 16  Temp: 98.6 F (37 C)  SpO2: 100%     FHT:176 Lab orders placed from triage:UA

## 2020-04-11 NOTE — Discharge Instructions (Signed)
[place] for [employer] at [facility]       Phone: [phone]  [place] for [employer] at [place]   Phone: [place] for [employer] at [place]  Phone: [place] for [place] at [facility]  Phone: [place] for [place] at [place]  Phone: [place] for [place] at [facility]   Phone: [facility]       Phone: [phone]  [facility]    Phone: [phone]   [facility]    Phone: [phone]  [facility]    Phone: [place]    Phone: [phone]  [place] [facility]       Phone: [phone]   [place] [facility]-M[facility]  Phone: [place]    Phone: [phone]  [facility]   Phone: [phone]  [facility]      Phone: [phone]  [facility]    Phone: [phone]

## 2020-04-11 NOTE — MAU Provider Note (Signed)
History     CSN: [id number]  Arrival date and time: 04/11/20 1212   Event Date/Time   First Provider Initiated Contact with Patient 04/11/20 1311     Chief Complaint  Patient presents with  . Vaginal Bleeding   Connie Duffy is a 29 y.o. G3P2 at [redacted]w[redacted]d by LMP who presents to MAU with complaints of vaginal bleeding and abdominal cramping. Patient reports abdominal pain and bleeding on and off since 11/16. Patient reports that bleeding is dark red and usually occurs as spotting when she wipes. Patient reports last IC yesterday. She describes abdominal pain as lower abdominal cramping that is intermittent. Denies pain currently. She denies any other complaints or concerns at this time.    OB History    Gravida  3   Para  2   Term  2   Preterm      AB      Living  2     SAB      IAB      Ectopic      Multiple      Live Births  2           Past Medical History:  Diagnosis Date  . Abnormal Pap smear   . Sickle cell trait [facility])     Past Surgical History:  Procedure Laterality Date  . COLPOSCOPY      Family History  Problem Relation Age of Onset  . Diabetes Maternal Aunt   . Diabetes Maternal Grandfather     Social History   Tobacco Use  . Smoking status: Current Every Day Smoker    Types: Cigars    Last attempt to quit: 09/10/2009    Years since quitting: 10.5  . Smokeless tobacco: Never Used  Substance Use Topics  . Alcohol use: Yes    Comment: occasional  . Drug use: No    Allergies: No Known Allergies  No medications prior to admission.    Review of Systems  Constitutional: Negative.   Respiratory: Negative.   Cardiovascular: Negative.   Gastrointestinal: Positive for abdominal pain. Negative for constipation, diarrhea, nausea and vomiting.  Genitourinary: Positive for vaginal bleeding. Negative for difficulty urinating, dysuria, frequency, pelvic pain and urgency.  Musculoskeletal: Negative.   Neurological: Negative.    Psychiatric/Behavioral: Negative.    Physical Exam   Blood pressure 126/69, pulse (!) 101, temperature 98.6 F (37 C), resp. rate 16, height 5\' 4"  (1.626 m), weight 64.8 kg, last menstrual period 01/14/2020, SpO2 100 %.  Physical Exam Vitals and nursing note reviewed. Exam conducted with a chaperone present.  HENT:     Head: Normocephalic.  Cardiovascular:     Rate and Rhythm: Normal rate and regular rhythm.  Pulmonary:     Effort: Pulmonary effort is normal. No respiratory distress.     Breath sounds: Normal breath sounds. No wheezing.  Abdominal:     General: There is no distension.     Palpations: Abdomen is soft. There is no mass.     Tenderness: There is abdominal tenderness. There is no guarding.     Comments: Tenderness with palpation in RLQ and suprapubic   Genitourinary:    Exam position: Lithotomy position.     Vagina: Bleeding present.     Comments: Pelvic exam: Cervix pink, multiparous cervix, without lesion, small amount of dark red mucous bleeding present, vaginal walls and external genitalia normal Bimanual exam: Cervix 0/long/high, firm, anterior, neg CMT, uterus nontender, nonenlarged, left adnexa without tenderness, enlargement, or  mass, right adnexa with tenderness, no enlargement or mass palpated.   Skin:    General: Skin is warm and dry.  Neurological:     Mental Status: She is alert and oriented to person, place, and time.  Psychiatric:        Mood and Affect: Mood normal.        Behavior: Behavior normal.        Thought Content: Thought content normal.    MAU Course  Procedures  MDM Orders Placed This Encounter  Procedures  . Wet prep, genital  . Culture, OB Urine  . US OB LESS THAN 14 WEEKS WITH OB TRANSVAGINAL  . Urinalysis, Routine w reflex microscopic  . CBC  . hCG, quantitative, pregnancy  . ABO/Rh   Labs and [place] report reviewed:  Results for orders placed or performed during the hospital encounter of 04/11/20 (from the past 24 hour(s))   Urinalysis, Routine w reflex microscopic Urine, Clean Catch     Status: Abnormal   Collection Time: 04/11/20 12:33 PM  Result Value Ref Range   Color, Urine YELLOW YELLOW   APPearance HAZY (A) CLEAR   Specific Gravity, Urine 1.009 1.005 - 1.030   pH 6.0 5.0 - 8.0   Glucose, UA NEGATIVE NEGATIVE mg/dL   Hgb urine dipstick MODERATE (A) NEGATIVE   Bilirubin Urine NEGATIVE NEGATIVE   Ketones, ur NEGATIVE NEGATIVE mg/dL   Protein, ur NEGATIVE NEGATIVE mg/dL   Nitrite POSITIVE (A) NEGATIVE   Leukocytes,Ua LARGE (A) NEGATIVE   RBC / HPF 11-20 0 - 5 RBC/hpf   WBC, UA >50 (H) 0 - 5 WBC/hpf   Bacteria, UA MANY (A) NONE SEEN   Squamous Epithelial / LPF 0-5 0 - 5   WBC Clumps PRESENT   CBC     Status: Abnormal   Collection Time: 04/11/20 12:38 PM  Result Value Ref Range   WBC 10.0 4.0 - 10.5 K/uL   RBC 3.80 (L) 3.87 - 5.11 MIL/uL   Hemoglobin 12.2 12.0 - 15.0 g/dL   HCT 33.4 (L) 36.0 - 46.0 %   MCV 87.9 80.0 - 100.0 fL   MCH 32.1 26.0 - 34.0 pg   MCHC 36.5 (H) 30.0 - 36.0 g/dL   RDW 11.8 11.5 - 15.5 %   Platelets 304 150 - 400 K/uL   nRBC 0.0 0.0 - 0.2 %  ABO/Rh     Status: None   Collection Time: 04/11/20 12:38 PM  Result Value Ref Range   ABO/RH(D) B POS    No rh immune globuloin      NOT A RH IMMUNE GLOBULIN CANDIDATE, PT RH POSITIVE Performed at [facility], [address], [place],  [address]   hCG, quantitative, pregnancy     Status: Abnormal   Collection Time: 04/11/20 12:38 PM  Result Value Ref Range   hCG, Beta Chain, Quant, S 82,432 (H) <5 mIU/mL  Wet prep, genital     Status: Abnormal   Collection Time: 04/11/20  1:22 PM  Result Value Ref Range   Yeast Wet Prep HPF POC NONE SEEN NONE SEEN   Trich, Wet Prep NONE SEEN NONE SEEN   Clue Cells Wet Prep HPF POC NONE SEEN NONE SEEN   WBC, Wet Prep HPF POC MANY (A) NONE SEEN   Sperm NONE SEEN    US OB LESS THAN 14 WEEKS WITH OB TRANSVAGINAL  Result Date: 04/11/2020 CLINICAL DATA:  Abdominal pain during  first trimester of pregnancy EXAM: OBSTETRIC <14 WK  [place] AND TRANSVAGINAL OB US TECHNIQUE: Both transabdominal and transvaginal ultrasound examinations were performed for complete evaluation of the gestation as well as the maternal uterus, adnexal regions, and pelvic cul-de-sac. Transvaginal technique was performed to assess early pregnancy. COMPARISON:  None FINDINGS: Intrauterine gestational sac: Present, single Yolk sac:  Not visualized Embryo:  Present Cardiac Activity: Present Heart Rate: 173 bpm CRL:  54.8 mm   12 w   0 d                  [place] EDC: 10/24/2020 Subchorionic hemorrhage:  None visualized. Maternal uterus/adnexae: Uterus anteverted, otherwise unremarkable. LEFT ovary normal size and morphology, 3.3 x 1.3 x 2.3 cm. No normal appearing RIGHT ovary visualized. In the RIGHT adnexa a complex mass is identified 8.0 x 5.1 x 6.7 cm, heterogeneous containing hypoechoic to hyperechoic regions with posterior shadowing and potentially a small calcification, favor large dermoid tumor. No definite normal appearing RIGHT ovary is visualized. No blood flow seen within the mass on color Doppler imaging. No free pelvic fluid or additional adnexal masses. IMPRESSION: Single live intrauterine gestation at 12 weeks 0 days EGA by crown-rump length. 8.0 cm diameter heterogeneous mass in RIGHT adnexa as above favor large dermoid tumor. No definite flow is seen within the mass on color Doppler imaging, though this could be related to relative hypovascularity of a dermoid tumor but making it impossible exclude RIGHT ovarian torsion. Recommend gynecological evaluation and may consider at noncontrast MR imaging to characterize the lesion and assess for torsion if clinically indicated Findings called to Darrol Poke CNM on 04/11/2020 at 1615 hours. Electronically Signed   By: Lavonia Dana M.D.   On: 04/11/2020 16:18   Dr Delbert Phenix called CNM @ [phone], at risk for ovarian torsion with dermoid cyst. Recommends GYN doctor to evaluate.  Consult with Dr Elgie Congo who came to bedside to evaluate patient - no concern with ovarian torsion based on patient's presentation, patient denies pain at this time.   Educated and discussed results of [place] and labs with patient. GC/C pending at this time. Precautions given on dermoid cyst and reasons to present back to MAU immediately, discussed with patient that dermoid cyst will have to be removed after pregnancy- patient verbalizes understanding. Discussed with patient UTI positive by the presence of nitrates - Rx for treatment sent to pharmacy of choice   Discussed reasons to return to MAU. Encouraged to make initial prenatal appointment. Return to MAU as needed. Pt stable at time of discharge. Discussed with patient pelvic rest for 7 days after bleeding, patient verbalizes understanding.   Meds ordered this encounter  Medications  . cefadroxil (DURICEF) 500 MG capsule    Sig: Take 1 capsule (500 mg total) by mouth 2 (two) times daily.    Dispense:  14 capsule    Refill:  0    Order Specific Question:   Supervising Provider    Answer:   Lynnda Shields A [[id number]]   Assessment and Plan   1. Normal IUP (intrauterine pregnancy) on prenatal ultrasound, first trimester   2. Abdominal pain during pregnancy in first trimester   3. [redacted] weeks gestation of pregnancy   4. Dermoid cyst of right ovary   5. Bleeding of unknown origin   6. Urinary tract infection during pregnancy in first trimester    Discharge home Encouraged to make appointment in the office to initiate prenatal care - list of OBGYNs in the area given to patient on AVS Return to MAU as needed  for reasons discussed and/or emergencies  Rx for Duricef    Follow-up Information    [facility] Follow up.   Specialty: Obstetrics and Gynecology Why: Return to MAU as needed for reasons discussed and/or emergencies  Contact information: [address] [phone] [place] [phone]             Allergies as of 04/11/2020   No Known Allergies     Medication List    TAKE these medications   cefadroxil 500 MG capsule Commonly known as: DURICEF Take 1 capsule (500 mg total) by mouth 2 (two) times daily.       Lajean Manes CNM 04/11/2020, 5:05 PM

## 2020-04-12 LAB — CULTURE, OB URINE

## 2020-04-12 LAB — GC/CHLAMYDIA PROBE AMP (~~LOC~~) NOT AT ARMC
Chlamydia: NEGATIVE
Comment: NEGATIVE
Comment: NORMAL
Neisseria Gonorrhea: POSITIVE — AB

## 2020-07-18 ENCOUNTER — Other Ambulatory Visit: Payer: Self-pay

## 2020-07-18 ENCOUNTER — Encounter (HOSPITAL_COMMUNITY): Payer: Self-pay

## 2020-07-18 ENCOUNTER — Ambulatory Visit (HOSPITAL_COMMUNITY)
Admission: EM | Admit: 2020-07-18 | Discharge: 2020-07-18 | Disposition: A | Discharge status: Home / Self Care | Payer: Medicaid Other | Attending: Family Medicine | Admitting: Family Medicine

## 2020-07-18 DIAGNOSIS — H10531 Contact blepharoconjunctivitis, right eye: Secondary | ICD-10-CM | POA: Diagnosis not present

## 2020-07-18 MED ORDER — ERYTHROMYCIN 5 MG/GM OP OINT: TOPICAL_OINTMENT | OPHTHALMIC | 0 refills | Status: DC

## 2020-07-18 NOTE — ED Provider Notes (Signed)
[place]    CSN: [id number] Arrival date & time: 07/18/20  0831      History   Chief Complaint Chief Complaint  Patient presents with  . Eye Problem    HPI Connie Duffy is a 30 y.o. female.   HPI   Patient presents today with 1 day of crusting of the right, diffuse redness, pain and irritation.  She is not a contact lens wearer.  She wears artificial eyelashes occasionally.  She initially had itching of the eye a couple days ago which is subsequently progressed.  She denies any visual acuity changes.  Past Medical History:  Diagnosis Date  . Abnormal Pap smear   . Sickle cell trait [facility])     Patient Active Problem List   Diagnosis Date Noted  . Dermoid cyst of right ovary 04/11/2020    Past Surgical History:  Procedure Laterality Date  . COLPOSCOPY      OB History    Gravida  3   Para  2   Term  2   Preterm      AB      Living  2     SAB      IAB      Ectopic      Multiple      Live Births  2            Home Medications    Prior to Admission medications   Medication Sig Start Date End Date Taking? Authorizing Provider  erythromycin ophthalmic ointment Place a 1/2 inch ribbon of ointment into the lower eyelid x 7 days at bedtime 07/18/20  Yes Scot Jun, FNP  cefadroxil (DURICEF) 500 MG capsule Take 1 capsule (500 mg total) by mouth 2 (two) times daily. 04/11/20   Lajean Manes, CNM    Family History Family History  Problem Relation Age of Onset  . Diabetes Maternal Aunt   . Diabetes Maternal Grandfather     Social History Social History   Tobacco Use  . Smoking status: Current Every Day Smoker    Types: Cigars    Last attempt to quit: 09/10/2009    Years since quitting: 10.8  . Smokeless tobacco: Never Used  Substance Use Topics  . Alcohol use: Yes    Comment: occasional  . Drug use: No     Allergies   Patient has no known allergies.   Review of Systems Review of Systems Pertinent  negatives listed in HPI   Physical Exam Triage Vital Signs ED Triage Vitals  Enc Vitals Group     BP 07/18/20 0918 112/66     Pulse Rate 07/18/20 0918 82     Resp 07/18/20 0918 16     Temp 07/18/20 0918 98.2 F (36.8 C)     Temp Source 07/18/20 0918 Oral     SpO2 07/18/20 0918 97 %     Weight --      Height --      Head Circumference --      Peak Flow --      Pain Score 07/18/20 0916 7     Pain Loc --      Pain Edu? --      Excl. in [place]? --    No data found.  Updated Vital Signs BP 112/66 (BP Location: Left Arm)   Pulse 82   Temp 98.2 F (36.8 C) (Oral)   Resp 16   LMP  (Within Weeks) Comment: 3 weeks  SpO2 97%   Breastfeeding No   Visual Acuity Right Eye Distance: 20/40 (Without correction ) Left Eye Distance: 20/30 (Without correction ) Bilateral Distance: 20/30 (Without correction )  Right Eye Near:   Left Eye Near:    Bilateral Near:     Physical Exam Constitutional:      Appearance: Normal appearance.  HENT:     Nose: Nose normal.  Eyes:     General: Lids are normal.        Right eye: Discharge present.     Extraocular Movements: Extraocular movements intact.     Right eye: Normal extraocular motion.     Conjunctiva/sclera:     Right eye: Right conjunctiva is injected. Exudate present.  Cardiovascular:     Rate and Rhythm: Normal rate and regular rhythm.  Pulmonary:     Effort: Pulmonary effort is normal.     Breath sounds: Normal breath sounds.  Musculoskeletal:     Cervical back: Normal range of motion.  Skin:    Capillary Refill: Capillary refill takes less than 2 seconds.  Neurological:     General: No focal deficit present.     Mental Status: She is alert.  Psychiatric:        Mood and Affect: Mood normal.        Behavior: Behavior normal.        Thought Content: Thought content normal.        Judgment: Judgment normal.      UC Treatments / Results  Labs (all labs ordered are listed, but only abnormal results are displayed) Labs  Reviewed - No data to display  EKG   Radiology No results found.  Procedures Procedures (including critical care time)  Medications Ordered in UC Medications - No data to display  Initial Impression / Assessment and Plan / UC Course  I have reviewed the triage vital signs and the nursing notes.  Pertinent labs & imaging results that were available during my care of the patient were reviewed by me and considered in my medical decision making (see chart for details).    Contact blepharoconjunctivitis of the right eye treating with erythromycin eye ointment to lower affected eyelid at bedtime daily x7 days.  Hygiene precautions given.  Return precautions given if symptoms worsen or do not improve.  Final Clinical Impressions(s) / UC Diagnoses   Final diagnoses:  Contact blepharoconjunctivitis of right eye   Discharge Instructions   None    ED Prescriptions    Medication Sig Dispense Auth. Provider   erythromycin ophthalmic ointment Place a 1/2 inch ribbon of ointment into the lower eyelid x 7 days at bedtime 3.5 g Scot Jun, FNP     PDMP not reviewed this encounter.   Kamali, Sakata, FNP 07/18/20 1014

## 2020-07-18 NOTE — ED Triage Notes (Signed)
Pt reports redness, pain and white drainage in right eye x 1 day.

## 2021-12-29 IMAGING — US US OB < 14 WEEKS - US OB TV
1 series · 15 of 28 positions shown · non-contrast
Comparison: None

CLINICAL DATA: Abdominal pain during first trimester of pregnancy

EXAM:
OBSTETRIC <14 WK US AND TRANSVAGINAL OB US
TECHNIQUE: Both transabdominal and transvaginal ultrasound examinations were
performed for complete evaluation of the gestation as well as the
maternal uterus, adnexal regions, and pelvic cul-de-sac.
Transvaginal technique was performed to assess early pregnancy.

[Series 1: us ob < 14 weeks - us ob tv · 73 acquisitions, 15 frames shown]
[im 1/73]
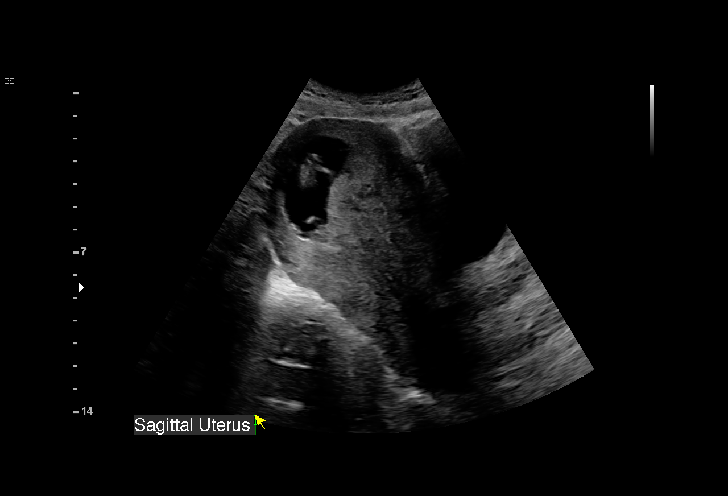
[im 6/73]
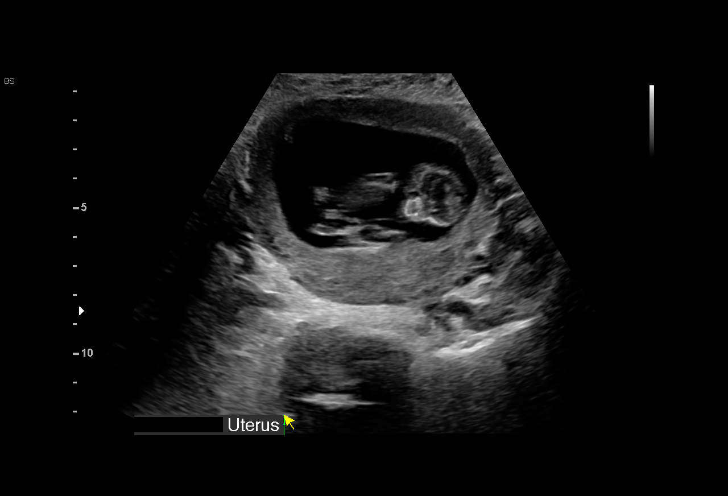
[im 11/73]
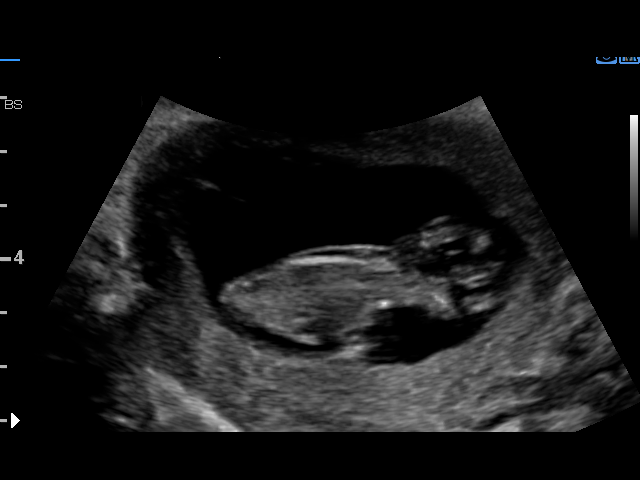
[im 17/73]
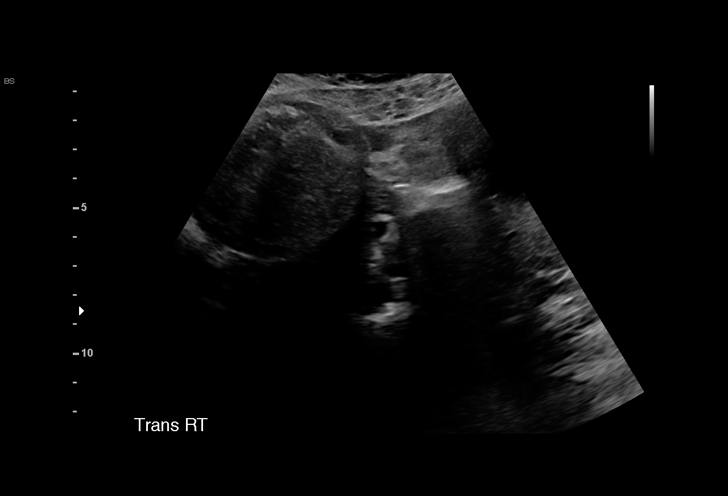
[im 22/73]
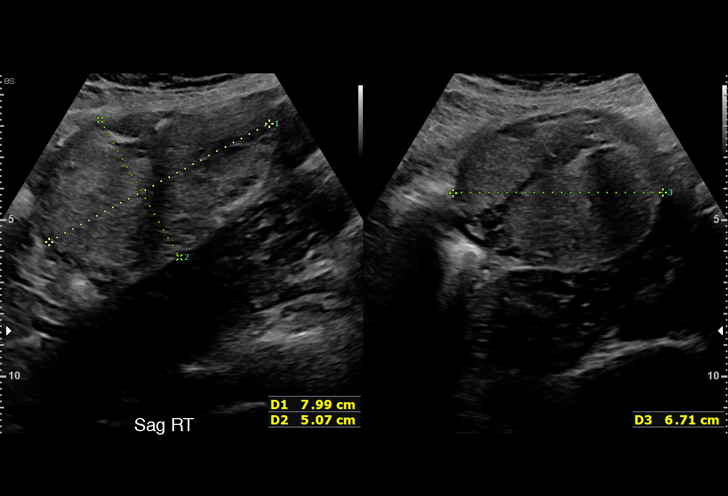
[im 27/73]
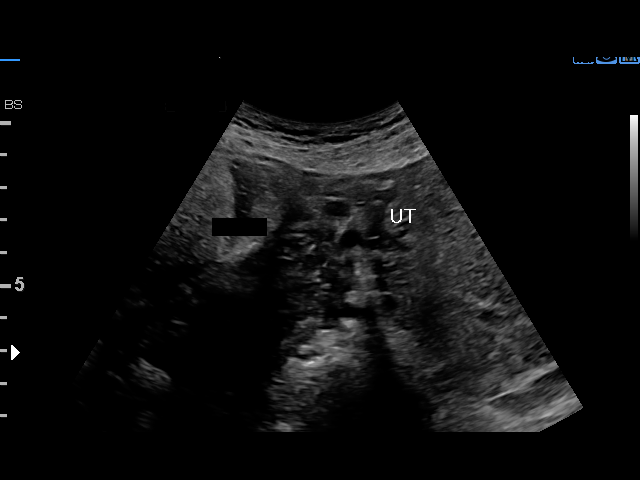
[im 33/73]
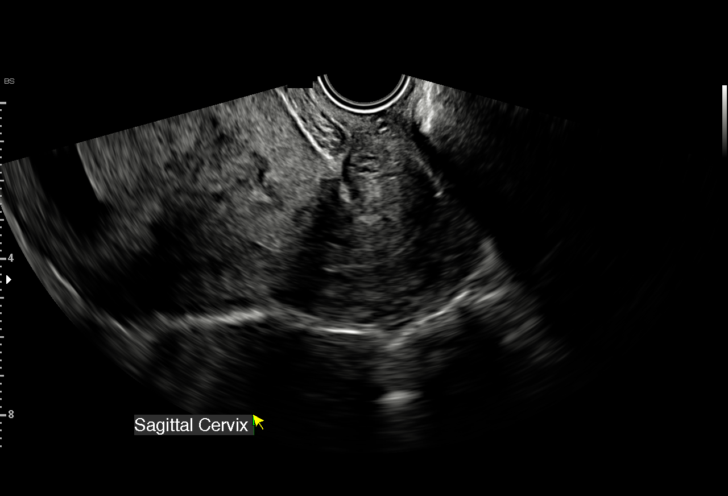
[im 38/73]
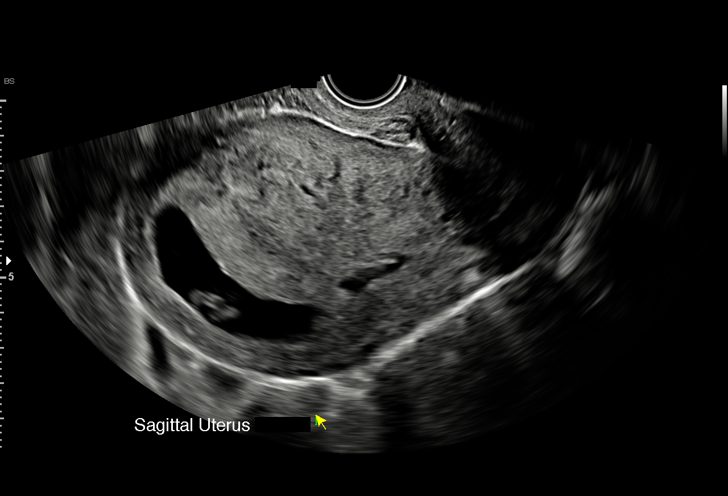
[im 41/73]
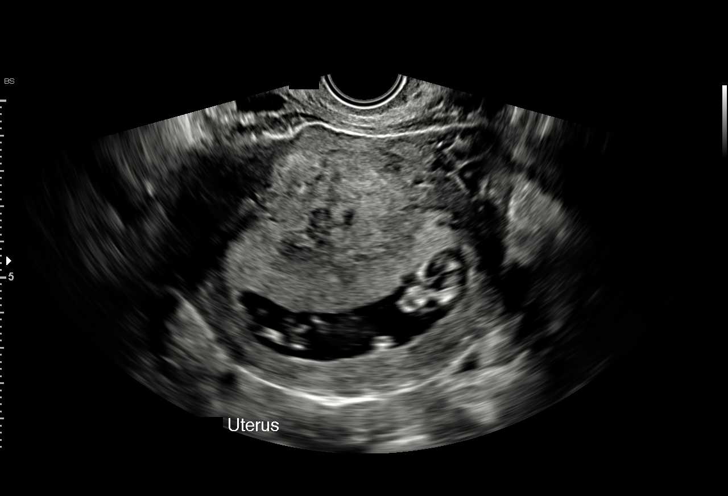
[im 46/73]
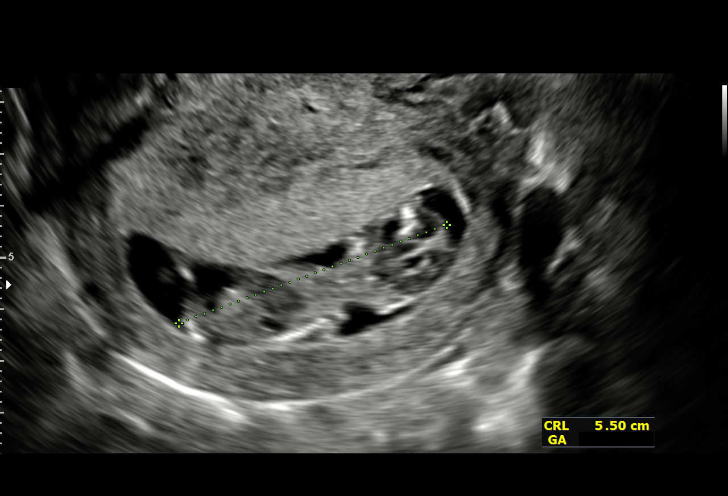
[im 51/73]
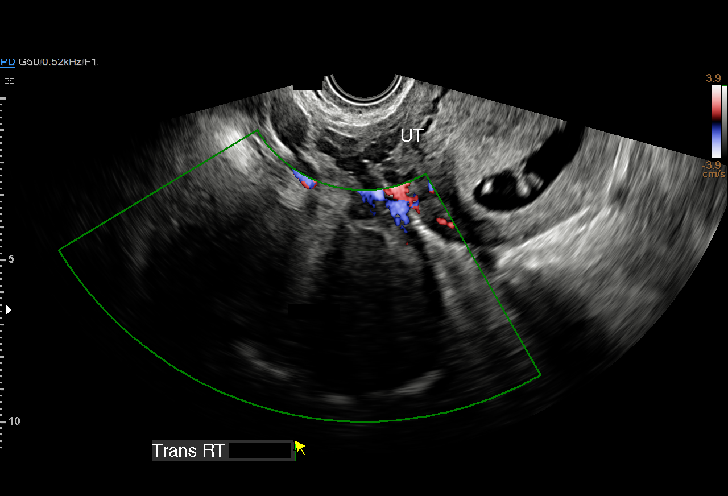
[im 57/73]
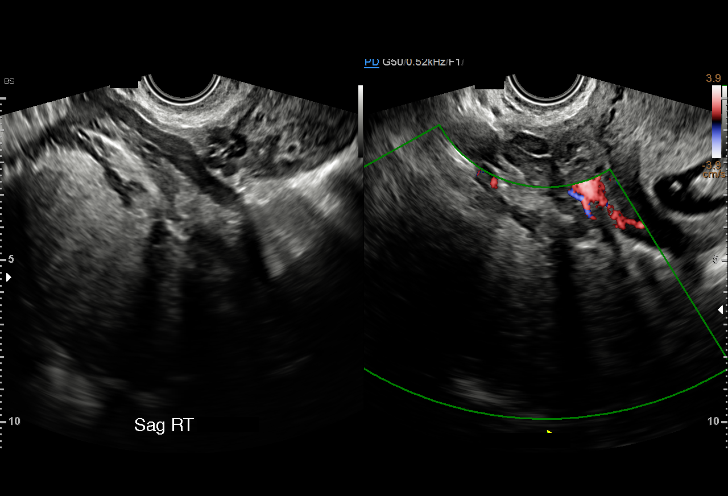
[im 62/73]
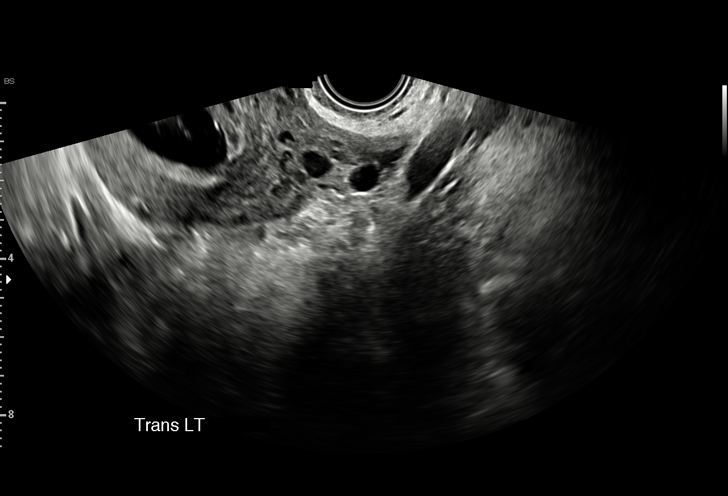
[im 67/73]
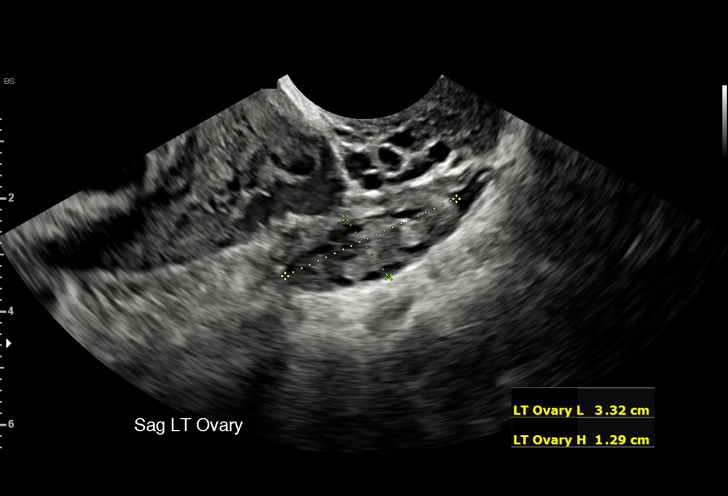
[im 73/73]
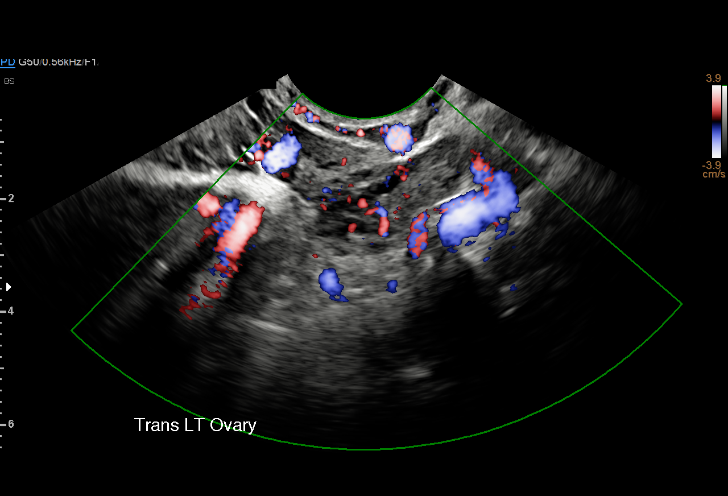

[15 of 28 positions shown; findings below may reference images not displayed]

FINDINGS: Intrauterine gestational sac: Present, single

Yolk sac:  Not visualized

Embryo:  Present

Cardiac Activity: Present

Heart Rate: 173 bpm

CRL:  54.8 mm   12 w   0 d                  US EDC: 10/24/2020

Subchorionic hemorrhage:  None visualized.

Maternal uterus/adnexae:

Uterus anteverted, otherwise unremarkable.

LEFT ovary normal size and morphology, 3.3 x 1.3 x 2.3 cm.

No normal appearing RIGHT ovary visualized.

In the RIGHT adnexa a complex mass is identified 8.0 x 5.1 x 6.7 cm,
heterogeneous containing hypoechoic to hyperechoic regions with
posterior shadowing and potentially a small calcification, favor
large dermoid tumor.

No definite normal appearing RIGHT ovary is visualized.

No blood flow seen within the mass on color Doppler imaging.

No free pelvic fluid or additional adnexal masses.
IMPRESSION: Single live intrauterine gestation at 12 weeks 0 days EGA by
crown-rump length.

8.0 cm diameter heterogeneous mass in RIGHT adnexa as above favor
large dermoid tumor.

No definite flow is seen within the mass on color Doppler imaging,
though this could be related to relative hypovascularity of a
dermoid tumor but making it impossible exclude RIGHT ovarian
torsion.

Recommend gynecological evaluation and may consider at noncontrast
MR imaging to characterize the lesion and assess for torsion if
clinically indicated

Findings called to Selyaydin Armada CNM on 04/11/2020 at 0908 hours.

## 2022-05-01 ENCOUNTER — Encounter (HOSPITAL_COMMUNITY): Payer: Self-pay | Admitting: *Deleted

## 2022-05-01 ENCOUNTER — Inpatient Hospital Stay (HOSPITAL_COMMUNITY): Payer: Medicaid Other

## 2022-05-01 ENCOUNTER — Inpatient Hospital Stay (HOSPITAL_COMMUNITY)
Admission: AD | Admit: 2022-05-01 | Discharge: 2022-05-01 | Disposition: A | Discharge status: Home / Self Care | Payer: Medicaid Other | Attending: Family Medicine | Admitting: Family Medicine

## 2022-05-01 DIAGNOSIS — Z86018 Personal history of other benign neoplasm: Secondary | ICD-10-CM | POA: Insufficient documentation

## 2022-05-01 DIAGNOSIS — Z3490 Encounter for supervision of normal pregnancy, unspecified, unspecified trimester: Secondary | ICD-10-CM

## 2022-05-01 DIAGNOSIS — O26891 Other specified pregnancy related conditions, first trimester: Secondary | ICD-10-CM | POA: Insufficient documentation

## 2022-05-01 DIAGNOSIS — R109 Unspecified abdominal pain: Secondary | ICD-10-CM | POA: Diagnosis not present

## 2022-05-01 DIAGNOSIS — Z3A01 Less than 8 weeks gestation of pregnancy: Secondary | ICD-10-CM | POA: Diagnosis not present

## 2022-05-01 DIAGNOSIS — Z3A10 10 weeks gestation of pregnancy: Secondary | ICD-10-CM | POA: Insufficient documentation

## 2022-05-01 DIAGNOSIS — D369 Benign neoplasm, unspecified site: Secondary | ICD-10-CM

## 2022-05-01 LAB — CBC
HCT: 37.3 % (ref 36.0–46.0)
Hemoglobin: 12.9 g/dL (ref 12.0–15.0)
MCH: 31.5 pg (ref 26.0–34.0)
MCHC: 34.6 g/dL (ref 30.0–36.0)
MCV: 91.2 fL (ref 80.0–100.0)
Platelets: 311 10*3/uL (ref 150–400)
RBC: 4.09 MIL/uL (ref 3.87–5.11)
RDW: 11.9 % (ref 11.5–15.5)
WBC: 7.2 10*3/uL (ref 4.0–10.5)
nRBC: 0 % (ref 0.0–0.2)

## 2022-05-01 LAB — URINALYSIS, ROUTINE W REFLEX MICROSCOPIC
Bilirubin Urine: NEGATIVE
Glucose, UA: 50 mg/dL — AB
Ketones, ur: NEGATIVE mg/dL
Nitrite: NEGATIVE
Protein, ur: NEGATIVE mg/dL
Specific Gravity, Urine: 1.01 (ref 1.005–1.030)
pH: 7 (ref 5.0–8.0)

## 2022-05-01 LAB — WET PREP, GENITAL
Clue Cells Wet Prep HPF POC: NONE SEEN
Sperm: NONE SEEN
Trich, Wet Prep: NONE SEEN
WBC, Wet Prep HPF POC: <10 (ref <10)
Yeast Wet Prep HPF POC: NONE SEEN

## 2022-05-01 LAB — HCG, QUANTITATIVE, PREGNANCY: hCG, Beta Chain, Quant, S: 15645 m[IU]/mL — ABNORMAL HIGH (ref <5)

## 2022-05-01 LAB — POCT PREGNANCY, URINE: Preg Test, Ur: POSITIVE — AB

## 2022-05-01 NOTE — MAU Note (Addendum)
.  Connie Duffy is a 32 y.o. at Unknown here in MAU reporting: she had a positive pregnancy test just before Christmas. Has wanted to have pregnancy terminated but was told she needed to confirm pregnancy and know how far along she was. Pt also state she has been having cramping off and on for 2 weeks. No vag bleeding and normal discharge.  LMP: end of October Onset of complaint: 2 weeks Pain score: 5 Vitals:   05/01/22 1736  BP: 130/61  Pulse: 86  Resp: 18  Temp: 98.8 F (37.1 C)     FHT: Lab orders placed from triage:

## 2022-05-01 NOTE — MAU Provider Note (Signed)
Chief Complaint:  Abdominal Pain  HPI: Connie Duffy is a 32 y.o. G4P2002 at 56w0dwho presents to maternity admissions reporting intermittent cramping for two weeks. Planning an abortion but could not get an appointment without a confirmed pregnancy. Denies any vaginal bleeding or other pregnancy related complaints.   Pregnancy Course: Uncomplicated to date, has a history of a dermoid cyst on her right ovary.  Past Medical History:  Diagnosis Date   Abnormal Pap smear    Sickle cell trait (HCC)    OB History  Gravida Para Term Preterm AB Living  '4 2 2     2  '$ SAB IAB Ectopic Multiple Live Births          2    # Outcome Date GA Lbr Len/2nd Weight Sex Delivery Anes PTL Lv  4 Current           3 Term 12/06/11 [id number]:30 / 01:07 8 lb 13.8 oz (4.02 kg) F Vag-Spont EPI  LIV     Birth Comments: WNL  2 Term 2011 [id number] 9 lb 11 oz (4.394 kg) M Vag-Spont EPI  LIV  1 Gravida            Past Surgical History:  Procedure Laterality Date   COLPOSCOPY     Family History  Problem Relation Age of Onset   Diabetes Maternal Aunt    Diabetes Maternal Grandfather    Social History   Tobacco Use   Smoking status: Every Day    Types: Cigars    Last attempt to quit: 09/10/2009    Years since quitting: 12.6   Smokeless tobacco: Never  Substance Use Topics   Alcohol use: Yes    Comment: occasional   Drug use: No   No Known Allergies Medications Prior to Admission  Medication Sig Dispense Refill Last Dose   cefadroxil (DURICEF) 500 MG capsule Take 1 capsule (500 mg total) by mouth 2 (two) times daily. 14 capsule 0    erythromycin ophthalmic ointment Place a 1/2 inch ribbon of ointment into the lower eyelid x 7 days at bedtime 3.5 g 0    I have reviewed patient's Past Medical Hx, Surgical Hx, Family Hx, Social Hx, medications and allergies.   ROS:  Pertinent items noted in HPI and remainder of comprehensive ROS otherwise negative.   Physical Exam  Patient Vitals for the past 24  hrs:  BP Temp Pulse Resp Height Weight  05/01/22 1736 130/61 98.8 F (37.1 C) 86 18 '5\' 5"'$  (1.651 m) 160 lb 11.2 oz (72.9 kg)   Constitutional: Well-developed, well-nourished female in no acute distress.  Cardiovascular: normal rate & rhythm, warm and well-perfused Respiratory: normal effort, no problems with respiration noted GI: Abd soft, non-tender MS: Extremities nontender, no edema, normal ROM Neurologic: Alert and oriented x 4.  GU: no CVA tenderness Pelvic: exam deferred, sent to ultrasound   Labs: Results for orders placed or performed during the hospital encounter of 05/01/22 (from the past 24 hour(s))  Pregnancy, urine POC     Status: Abnormal   Collection Time: 05/01/22  5:42 PM  Result Value Ref Range   Preg Test, Ur POSITIVE (A) NEGATIVE  Wet prep, genital     Status: None   Collection Time: 05/01/22  6:04 PM   Specimen: Vaginal  Result Value Ref Range   Yeast Wet Prep HPF POC NONE SEEN NONE SEEN   Trich, Wet Prep NONE SEEN NONE SEEN   Clue Cells Wet Prep HPF POC  NONE SEEN NONE SEEN   WBC, Wet Prep HPF POC <10 <10   Sperm NONE SEEN   Urinalysis, Routine w reflex microscopic Urine, Clean Catch     Status: Abnormal   Collection Time: 05/01/22  6:06 PM  Result Value Ref Range   Color, Urine YELLOW YELLOW   APPearance HAZY (A) CLEAR   Specific Gravity, Urine 1.010 1.005 - 1.030   pH 7.0 5.0 - 8.0   Glucose, UA 50 (A) NEGATIVE mg/dL   Hgb urine dipstick SMALL (A) NEGATIVE   Bilirubin Urine NEGATIVE NEGATIVE   Ketones, ur NEGATIVE NEGATIVE mg/dL   Protein, ur NEGATIVE NEGATIVE mg/dL   Nitrite NEGATIVE NEGATIVE   Leukocytes,Ua MODERATE (A) NEGATIVE   RBC / HPF 6-10 0 - 5 RBC/hpf   WBC, UA 11-20 0 - 5 WBC/hpf   Bacteria, UA MANY (A) NONE SEEN   Squamous Epithelial / HPF 0-5 0 - 5 /HPF  CBC     Status: None   Collection Time: 05/01/22  6:12 PM  Result Value Ref Range   WBC 7.2 4.0 - 10.5 K/uL   RBC 4.09 3.87 - 5.11 MIL/uL   Hemoglobin 12.9 12.0 - 15.0 g/dL    HCT 37.3 36.0 - 46.0 %   MCV 91.2 80.0 - 100.0 fL   MCH 31.5 26.0 - 34.0 pg   MCHC 34.6 30.0 - 36.0 g/dL   RDW 11.9 11.5 - 15.5 %   Platelets 311 150 - 400 K/uL   nRBC 0.0 0.0 - 0.2 %  hCG, quantitative, pregnancy     Status: Abnormal   Collection Time: 05/01/22  6:12 PM  Result Value Ref Range   hCG, Beta Chain, Quant, S 15,645 (H) <5 mIU/mL   Imaging:  US OB LESS THAN 14 WEEKS WITH OB TRANSVAGINAL  Result Date: 05/01/2022 CLINICAL DATA:  [id number] Abdominal cramping [id number] EXAM: OBSTETRIC <14 WK [place] AND TRANSVAGINAL OB US TECHNIQUE: Transvaginal ultrasound was performed for complete evaluation of the gestation as well as the maternal uterus, adnexal regions, and pelvic cul-de-sac. COMPARISON:  04/11/2020 FINDINGS: Intrauterine gestational sac: Single Yolk sac:  Visualized. Embryo:  Visualized. Cardiac Activity: Visualized. Heart Rate: 0 5 bpm CRL:   0.4 cm = 6 weeks 1 day [place] EDC: 12/24/2022 Subchorionic hemorrhage:  None visualized. Adnexa: Right adnexal echogenic lesion identified, typical for a dermoid measuring about 8.5 cm. This is a stable finding. Otherwise no adnexal masses or fluid collections. IMPRESSION: 1. Single viable intrauterine pregnancy 6 weeks 1 day with fetal heart motion. Ultrasound [facility] 12/24/2022. 2. Right adnexal 8.5 cm lesion consistent with dermoid, a stable finding. Electronically Signed   By: Sammie Bench M.D.   On: 05/01/2022 20:37    MAU Course: Orders Placed This Encounter  Procedures   Wet prep, genital   Culture, OB Urine   US OB LESS THAN 14 WEEKS WITH OB TRANSVAGINAL   CBC   hCG, quantitative, pregnancy   Urinalysis, Routine w reflex microscopic Urine, Clean Catch   Nursing communication   Pregnancy, urine POC   Discharge patient   No orders of the defined types were placed in this encounter.  Course -  Pt stable and MAU acuity high, self-swabs and labs collected, pt sent to ultrasound. When ultrasound report filed, initially said 5 bpm, called for  an addendum as images show 105bpm.  Reviewed results with patient, she is still planning not to continue the pregnancy. Ultrasound report printed out so she has proof of pregnancy dating.  MDM: Low  Assessment: 1. Intrauterine pregnancy   2. Dermoid cyst   3. [redacted] weeks gestation of pregnancy    Plan: Discharge home in stable condition.  Follow up as needed with gyn or to establish OB care    Allergies as of 05/01/2022   No Known Allergies      Medication List     STOP taking these medications    cefadroxil 500 MG capsule Commonly known as: DURICEF   erythromycin ophthalmic ointment       Gaylan Gerold, CNM, MSN, [place] Certified Nurse Midwife, [place]

## 2022-05-04 LAB — CULTURE, OB URINE: Culture: >=100000 — AB

## 2022-05-04 LAB — GC/CHLAMYDIA PROBE AMP (~~LOC~~) NOT AT ARMC
Chlamydia: NEGATIVE
Comment: NEGATIVE
Comment: NORMAL
Neisseria Gonorrhea: NEGATIVE

## 2022-05-05 ENCOUNTER — Telehealth: Payer: Self-pay | Admitting: Certified Nurse Midwife

## 2022-05-05 DIAGNOSIS — O2341 Unspecified infection of urinary tract in pregnancy, first trimester: Secondary | ICD-10-CM

## 2022-05-05 DIAGNOSIS — O2340 Unspecified infection of urinary tract in pregnancy, unspecified trimester: Secondary | ICD-10-CM | POA: Insufficient documentation

## 2022-05-05 MED ORDER — CEFADROXIL 500 MG PO CAPS: 500.0000 mg | ORAL_CAPSULE | Freq: Two times a day (BID) | ORAL | 0 refills | Status: DC

## 2022-05-05 NOTE — Telephone Encounter (Signed)
Informed of UTI. Rx sent. Pt plans to continue pregnancy and we discussed starting care around 10-12 weeks. List of OB providers given. Recommend starting PNV daily.

## 2022-05-19 ENCOUNTER — Inpatient Hospital Stay (HOSPITAL_COMMUNITY): Payer: Medicaid Other

## 2022-05-19 ENCOUNTER — Inpatient Hospital Stay (HOSPITAL_COMMUNITY)
Admission: AD | Admit: 2022-05-19 | Discharge: 2022-05-19 | Disposition: A | Discharge status: Home / Self Care | Payer: Medicaid Other | Attending: Obstetrics and Gynecology | Admitting: Obstetrics and Gynecology

## 2022-05-19 ENCOUNTER — Encounter (HOSPITAL_COMMUNITY): Payer: Self-pay | Admitting: Obstetrics and Gynecology

## 2022-05-19 ENCOUNTER — Other Ambulatory Visit: Payer: Self-pay

## 2022-05-19 DIAGNOSIS — O99011 Anemia complicating pregnancy, first trimester: Secondary | ICD-10-CM | POA: Insufficient documentation

## 2022-05-19 DIAGNOSIS — N390 Urinary tract infection, site not specified: Secondary | ICD-10-CM | POA: Diagnosis not present

## 2022-05-19 DIAGNOSIS — O26891 Other specified pregnancy related conditions, first trimester: Secondary | ICD-10-CM | POA: Diagnosis not present

## 2022-05-19 DIAGNOSIS — Z3A08 8 weeks gestation of pregnancy: Secondary | ICD-10-CM | POA: Diagnosis not present

## 2022-05-19 DIAGNOSIS — O2341 Unspecified infection of urinary tract in pregnancy, first trimester: Secondary | ICD-10-CM | POA: Diagnosis not present

## 2022-05-19 DIAGNOSIS — O219 Vomiting of pregnancy, unspecified: Secondary | ICD-10-CM | POA: Insufficient documentation

## 2022-05-19 HISTORY — DX: Urinary tract infection, site not specified: N39.0

## 2022-05-19 HISTORY — DX: Other specified health status: Z78.9

## 2022-05-19 HISTORY — DX: Tubulo-interstitial nephritis, not specified as acute or chronic: N12

## 2022-05-19 LAB — CBC
HCT: 36.4 % (ref 36.0–46.0)
Hemoglobin: 13 g/dL (ref 12.0–15.0)
MCH: 31.6 pg (ref 26.0–34.0)
MCHC: 35.7 g/dL (ref 30.0–36.0)
MCV: 88.3 fL (ref 80.0–100.0)
Platelets: 296 10*3/uL (ref 150–400)
RBC: 4.12 MIL/uL (ref 3.87–5.11)
RDW: 11.9 % (ref 11.5–15.5)
WBC: 14.4 10*3/uL — ABNORMAL HIGH (ref 4.0–10.5)
nRBC: 0 % (ref 0.0–0.2)

## 2022-05-19 LAB — COMPREHENSIVE METABOLIC PANEL
ALT: 15 U/L (ref 0–44)
AST: 21 U/L (ref 15–41)
Albumin: 3.4 g/dL — ABNORMAL LOW (ref 3.5–5.0)
Alkaline Phosphatase: 64 U/L (ref 38–126)
Anion gap: 9 (ref 5–15)
BUN: 7 mg/dL (ref 6–20)
CO2: 23 mmol/L (ref 22–32)
Calcium: 8.6 mg/dL — ABNORMAL LOW (ref 8.9–10.3)
Chloride: 98 mmol/L (ref 98–111)
Creatinine, Ser: 0.84 mg/dL (ref 0.44–1.00)
GFR, Estimated: >60 mL/min (ref >60)
Glucose, Bld: 116 mg/dL — ABNORMAL HIGH (ref 70–99)
Potassium: 3.7 mmol/L (ref 3.5–5.1)
Sodium: 130 mmol/L — ABNORMAL LOW (ref 135–145)
Total Bilirubin: 1.5 mg/dL — ABNORMAL HIGH (ref 0.3–1.2)
Total Protein: 7.3 g/dL (ref 6.5–8.1)

## 2022-05-19 LAB — URINALYSIS, ROUTINE W REFLEX MICROSCOPIC
Bilirubin Urine: NEGATIVE
Glucose, UA: NEGATIVE mg/dL
Ketones, ur: 5 mg/dL — AB
Nitrite: NEGATIVE
Protein, ur: 100 mg/dL — AB
RBC / HPF: >50 RBC/hpf — ABNORMAL HIGH (ref 0–5)
Specific Gravity, Urine: 1.011 (ref 1.005–1.030)
WBC, UA: >50 WBC/hpf — ABNORMAL HIGH (ref 0–5)
pH: 5 (ref 5.0–8.0)

## 2022-05-19 MED ORDER — SODIUM CHLORIDE 0.9 % IV BOLUS
1000.0000 mL | Freq: Once | INTRAVENOUS | Status: AC
  Administered 2022-05-19: 1000 mL via INTRAVENOUS

## 2022-05-19 MED ORDER — PROMETHAZINE HCL 25 MG PO TABS: 12.5000 mg | ORAL_TABLET | Freq: Four times a day (QID) | ORAL | 0 refills | Status: DC | PRN

## 2022-05-19 MED ORDER — LACTATED RINGERS IV BOLUS: 1000.0000 mL | Freq: Once | INTRAVENOUS | Status: DC

## 2022-05-19 MED ORDER — SODIUM CHLORIDE 0.9 % IV SOLN
1.0000 g | INTRAVENOUS | Status: DC
  Administered 2022-05-19: 1 g via INTRAVENOUS
  Filled 2022-05-19: qty 10

## 2022-05-19 MED ORDER — SODIUM CHLORIDE 0.9 % IV SOLN: INTRAVENOUS | Status: DC

## 2022-05-19 MED ORDER — SODIUM CHLORIDE 0.9 % IV SOLN
25.0000 mg | Freq: Once | INTRAVENOUS | Status: AC
  Administered 2022-05-19: 25 mg via INTRAVENOUS
  Filled 2022-05-19: qty 1

## 2022-05-19 MED ORDER — CEFADROXIL 500 MG PO CAPS: 500.0000 mg | ORAL_CAPSULE | Freq: Two times a day (BID) | ORAL | 0 refills | Status: DC

## 2022-05-19 NOTE — MAU Provider Note (Signed)
[place]     CSN: [id number]  Arrival date and time: 05/19/22 1039   Event Date/Time   First Provider Initiated Contact with Patient 05/19/22 1146      Chief Complaint  Patient presents with   Back Pain   Nausea   Abdominal Pain   32 y.o. [id number] '@[redacted]w[redacted]d'$  presenting with N/V and back pain. Sx stated yesterday. States she vomited all day yesterday. Was afraid to eat anything today but had some water. Denies diarrhea. Reports chills and feeling hot. Has not checked temp at home. Back pain is located mid back on the right. Rates pain 9/10. Has not treated. Pt reports not finishing abx for recent UTI.   OB History     Gravida  4   Para  2   Term  2   Preterm      AB      Living  2      SAB      IAB      Ectopic      Multiple      Live Births  2           Past Medical History:  Diagnosis Date   Abnormal Pap smear    Medical history non-contributory    Pyelonephritis    Recurrent UTI    Sickle cell trait (HCC)     Past Surgical History:  Procedure Laterality Date   COLPOSCOPY      Family History  Problem Relation Age of Onset   Healthy Mother    Healthy Father    Diabetes Maternal Aunt    Diabetes Maternal Grandfather     Social History   Tobacco Use   Smoking status: Former    Types: Cigars    Quit date: 09/10/2009    Years since quitting: 12.6   Smokeless tobacco: Never  Vaping Use   Vaping Use: Never used  Substance Use Topics   Alcohol use: Not Currently    Comment: occasional   Drug use: No    Allergies: No Known Allergies  Medications Prior to Admission  Medication Sig Dispense Refill Last Dose   [DISCONTINUED] cefadroxil (DURICEF) 500 MG capsule Take 1 capsule (500 mg total) by mouth 2 (two) times daily. 14 capsule 0     Review of Systems  Constitutional:  Positive for chills. Negative for fever.  Gastrointestinal:  Positive for abdominal pain (occ cramps), nausea and vomiting. Negative for diarrhea.  Genitourinary:   Positive for frequency. Negative for dysuria, hematuria, urgency, vaginal bleeding and vaginal discharge.  Musculoskeletal:  Positive for back pain.   Physical Exam   Blood pressure 116/62, pulse 92, temperature 99.2 F (37.3 C), temperature source Oral, resp. rate 14, height '5\' 5"'$  (1.651 m), weight 68.9 kg, last menstrual period 02/20/2022, SpO2 100 %.  Physical Exam Vitals and nursing note reviewed.  Constitutional:      General: She is not in acute distress.    Appearance: Normal appearance.  HENT:     Head: Normocephalic and atraumatic.  Cardiovascular:     Rate and Rhythm: Normal rate.  Pulmonary:     Effort: Pulmonary effort is normal. No respiratory distress.  Abdominal:     General: There is no distension.     Palpations: Abdomen is soft. There is no mass.     Tenderness: There is no abdominal tenderness. There is no right CVA tenderness, left CVA tenderness, guarding or rebound.     Hernia: No hernia is present.  Musculoskeletal:  General: Normal range of motion.     Cervical back: Normal range of motion.  Skin:    General: Skin is warm and dry.  Neurological:     General: No focal deficit present.     Mental Status: She is alert and oriented to person, place, and time.  Psychiatric:        Mood and Affect: Mood normal.        Behavior: Behavior normal.    Results for orders placed or performed during the hospital encounter of 05/19/22 (from the past 24 hour(s))  CBC     Status: Abnormal   Collection Time: 05/19/22 12:17 PM  Result Value Ref Range   WBC 14.4 (H) 4.0 - 10.5 K/uL   RBC 4.12 3.87 - 5.11 MIL/uL   Hemoglobin 13.0 12.0 - 15.0 g/dL   HCT 36.4 36.0 - 46.0 %   MCV 88.3 80.0 - 100.0 fL   MCH 31.6 26.0 - 34.0 pg   MCHC 35.7 30.0 - 36.0 g/dL   RDW 11.9 11.5 - 15.5 %   Platelets 296 150 - 400 K/uL   nRBC 0.0 0.0 - 0.2 %  Comprehensive metabolic panel     Status: Abnormal   Collection Time: 05/19/22 12:17 PM  Result Value Ref Range   Sodium 130  (L) 135 - 145 mmol/L   Potassium 3.7 3.5 - 5.1 mmol/L   Chloride 98 98 - 111 mmol/L   CO2 23 22 - 32 mmol/L   Glucose, Bld 116 (H) 70 - 99 mg/dL   BUN 7 6 - 20 mg/dL   Creatinine, Ser 0.84 0.44 - 1.00 mg/dL   Calcium 8.6 (L) 8.9 - 10.3 mg/dL   Total Protein 7.3 6.5 - 8.1 g/dL   Albumin 3.4 (L) 3.5 - 5.0 g/dL   AST 21 15 - 41 U/L   ALT 15 0 - 44 U/L   Alkaline Phosphatase 64 38 - 126 U/L   Total Bilirubin 1.5 (H) 0.3 - 1.2 mg/dL   GFR, Estimated >60 >60 mL/min   Anion gap 9 5 - 15  Urinalysis, Routine w reflex microscopic Urine, Clean Catch     Status: Abnormal   Collection Time: 05/19/22 12:35 PM  Result Value Ref Range   Color, Urine YELLOW YELLOW   APPearance HAZY (A) CLEAR   Specific Gravity, Urine 1.011 1.005 - 1.030   pH 5.0 5.0 - 8.0   Glucose, UA NEGATIVE NEGATIVE mg/dL   Hgb urine dipstick LARGE (A) NEGATIVE   Bilirubin Urine NEGATIVE NEGATIVE   Ketones, ur 5 (A) NEGATIVE mg/dL   Protein, ur 100 (A) NEGATIVE mg/dL   Nitrite NEGATIVE NEGATIVE   Leukocytes,Ua LARGE (A) NEGATIVE   RBC / HPF >50 (H) 0 - 5 RBC/hpf   WBC, UA >50 (H) 0 - 5 WBC/hpf   Bacteria, UA RARE (A) NONE SEEN   Squamous Epithelial / HPF 0-5 0 - 5 /HPF   WBC Clumps PRESENT    Mucus PRESENT    US RENAL  Result Date: 05/19/2022 CLINICAL DATA:  Right flank pain.  History of right ovarian dermoid EXAM: RENAL / URINARY TRACT ULTRASOUND COMPLETE COMPARISON:  Pelvic ultrasound 05/01/2022 FINDINGS: Right Kidney: Renal measurements: 11.1 x 4.3 x 4.2 cm = volume: 104 mL. Echogenicity within normal limits. Mild right hydroureteronephrosis. No mass or shadowing stone visualized. Left Kidney: Renal measurements: 10.3 x 4.9 x 5.6 cm = volume: 147 mL. Echogenicity within normal limits. No mass or hydronephrosis visualized. Bladder: Appears normal for degree  of bladder distention. Other: Known right adnexal mass measuring approximately 8.1 x 6.2 x 6.7 cm, incompletely evaluated on this exam, but previously characterized  as a dermoid. IMPRESSION: 1. Mild right hydroureteronephrosis. 2. Known right adnexal mass measuring up to 8.1 cm, previously characterized as a dermoid. Electronically Signed   By: Davina Poke D.O.   On: 05/19/2022 13:51    MAU Course  Procedures NS 1L Rocephin Phenergan  MDM Labs ordered and reviewed. Feeling better after meds. Tolerating po. Consult with Dr. Damita Dunnings, ok for outpt management. Stable for discharge home.   Assessment and Plan   1. [redacted] weeks gestation of pregnancy   2. Urinary tract infection in mother during first trimester of pregnancy    Discharge home Follow up with OB provider to start care Rx Duricef Rx Phenergan Strict return precautions  Allergies as of 05/19/2022   No Known Allergies      Medication List     TAKE these medications    cefadroxil 500 MG capsule Commonly known as: DURICEF Take 1 capsule (500 mg total) by mouth 2 (two) times daily.   promethazine 25 MG tablet Commonly known as: PHENERGAN Take 0.5-1 tablets (12.5-25 mg total) by mouth every 6 (six) hours as needed for nausea or vomiting.        Julianne Handler, CNM 05/19/2022, 4:07 PM

## 2022-05-19 NOTE — MAU Note (Signed)
Connie Duffy is a 32 y.o. at 52w4dhere in MAU reporting: nausea, emesis, back and abdominal pain. Did not finish abx for UTI. Thinks she may have a kidney infection.  Onset of complaint: yesterday  Pain score: back 9/10 Vitals:   05/19/22 1120  BP: 115/69  Pulse: 100  Resp: 16  Temp: 100 F (37.8 C)  SpO2: 98%     FHT:NA  Lab orders placed from triage: UA

## 2022-05-20 LAB — CULTURE, OB URINE: Culture: NO GROWTH

## 2022-07-30 ENCOUNTER — Encounter (HOSPITAL_COMMUNITY): Payer: Self-pay | Admitting: *Deleted

## 2022-07-30 ENCOUNTER — Ambulatory Visit (HOSPITAL_COMMUNITY)
Admission: EM | Admit: 2022-07-30 | Discharge: 2022-07-30 | Disposition: A | Discharge status: Home / Self Care | Payer: Medicaid Other | Attending: Family Medicine | Admitting: Family Medicine

## 2022-07-30 DIAGNOSIS — N3090 Cystitis, unspecified without hematuria: Secondary | ICD-10-CM | POA: Insufficient documentation

## 2022-07-30 LAB — POCT URINALYSIS DIPSTICK, ED / UC
Bilirubin Urine: NEGATIVE
Glucose, UA: NEGATIVE mg/dL
Ketones, ur: 80 mg/dL — AB
Nitrite: POSITIVE — AB
Protein, ur: 100 mg/dL — AB
Specific Gravity, Urine: 1.015 (ref 1.005–1.030)
Urobilinogen, UA: 2 mg/dL — ABNORMAL HIGH (ref 0.0–1.0)
pH: 6 (ref 5.0–8.0)

## 2022-07-30 MED ORDER — NITROFURANTOIN MONOHYD MACRO 100 MG PO CAPS: 100.0000 mg | ORAL_CAPSULE | Freq: Two times a day (BID) | ORAL | 0 refills | Status: DC

## 2022-07-30 NOTE — ED Triage Notes (Signed)
Pt states she started having lower back pain 2 days ago then nausea, chills, and urine frequency started yesterday. She isnt taking nay meds.

## 2022-07-30 NOTE — Discharge Instructions (Signed)
Urinalysis had some white blood cells, nitrites, and blood.  Though you are on your menstrual cycle, this could be a sign of a bladder infection.  Take nitrofurantoin 100 mg--1 capsule 2 times daily for 5 days  Urine culture is sent, and staff will notify you if there is anything that needs to be changed on your antibiotic.

## 2022-07-30 NOTE — ED Provider Notes (Signed)
[place]    CSN: [id number] Arrival date & time: 07/30/22  [id number]      History   Chief Complaint Chief Complaint  Patient presents with   Back Pain   Urinary Frequency   Chills   Nausea    HPI Connie Duffy is a 32 y.o. female.    Back Pain Urinary Frequency   Here for low back pain that began on April 2.  Then in the last 24 hours she has begun having some nausea and some urinary frequency and also some dysuria.  Last menstrual cycle is now.  She was pregnant in January but then had an abortion  Past Medical History:  Diagnosis Date   Abnormal Pap smear    Medical history non-contributory    Pyelonephritis    Recurrent UTI    Sickle cell trait     Patient Active Problem List   Diagnosis Date Noted   UTI (urinary tract infection) during pregnancy 05/05/2022   Dermoid cyst of right ovary 04/11/2020    Past Surgical History:  Procedure Laterality Date   COLPOSCOPY      OB History     Gravida  4   Para  2   Term  2   Preterm      AB      Living  2      SAB      IAB      Ectopic      Multiple      Live Births  2            Home Medications    Prior to Admission medications   Medication Sig Start Date End Date Taking? Authorizing Provider  nitrofurantoin, macrocrystal-monohydrate, (MACROBID) 100 MG capsule Take 1 capsule (100 mg total) by mouth 2 (two) times daily. 07/30/22  Yes Barrett Henle, MD  SPRINTEC 28 0.25-35 MG-MCG tablet Take 1 tablet by mouth daily. 07/05/22  Yes [provider]    Family History Family History  Problem Relation Age of Onset   Healthy Mother    Healthy Father    Diabetes Maternal Aunt    Diabetes Maternal Grandfather     Social History Social History   Tobacco Use   Smoking status: Former    Types: Cigars    Quit date: 09/10/2009    Years since quitting: 12.8   Smokeless tobacco: Never  Vaping Use   Vaping Use: Never used  Substance Use Topics   Alcohol use:  Not Currently    Comment: occasional   Drug use: No     Allergies   Patient has no known allergies.   Review of Systems Review of Systems  Genitourinary:  Positive for frequency.  Musculoskeletal:  Positive for back pain.     Physical Exam Triage Vital Signs ED Triage Vitals  Enc Vitals Group     BP 07/30/22 0838 119/77     Pulse Rate 07/30/22 0838 100     Resp 07/30/22 0838 18     Temp 07/30/22 0838 98.9 F (37.2 C)     Temp Source 07/30/22 0838 Oral     SpO2 07/30/22 0838 98 %     Weight --      Height --      Head Circumference --      Peak Flow --      Pain Score 07/30/22 0837 9     Pain Loc --      Pain Edu? --  Excl. in GC? --    No data found.  Updated Vital Signs BP 119/77 (BP Location: Right Arm)   Pulse 100   Temp 98.9 F (37.2 C) (Oral)   Resp 18   LMP 07/29/2022 (Within Days)   SpO2 98%   Breastfeeding Unknown   Visual Acuity Right Eye Distance:   Left Eye Distance:   Bilateral Distance:    Right Eye Near:   Left Eye Near:    Bilateral Near:     Physical Exam Vitals reviewed.  Constitutional:      General: She is not in acute distress.    Appearance: She is not ill-appearing, toxic-appearing or diaphoretic.  Cardiovascular:     Rate and Rhythm: Normal rate and regular rhythm.     Heart sounds: No murmur heard. Pulmonary:     Effort: Pulmonary effort is normal.     Breath sounds: Normal breath sounds.  Abdominal:     Palpations: Abdomen is soft.     Tenderness: There is no abdominal tenderness.  Skin:    Coloration: Skin is not jaundiced or pale.  Neurological:     General: No focal deficit present.     Mental Status: She is alert and oriented to person, place, and time.  Psychiatric:        Behavior: Behavior normal.      UC Treatments / Results  Labs (all labs ordered are listed, but only abnormal results are displayed) Labs Reviewed  POCT URINALYSIS DIPSTICK, ED / UC - Abnormal; Notable for the following  components:      Result Value   Ketones, ur 80 (*)    Hgb urine dipstick LARGE (*)    Protein, ur 100 (*)    Urobilinogen, UA 2.0 (*)    Nitrite POSITIVE (*)    Leukocytes,Ua SMALL (*)    All other components within normal limits  URINE CULTURE    EKG   Radiology No results found.  Procedures Procedures (including critical care time)  Medications Ordered in UC Medications - No data to display  Initial Impression / Assessment and Plan / UC Course  I have reviewed the triage vital signs and the nursing notes.  Pertinent labs & imaging results that were available during my care of the patient were reviewed by me and considered in my medical decision making (see chart for details).        UA shows nitrates, small amount of leukocytes, and large amount of blood.  She is on her menstrual cycle.  Urine culture is sent and I will treat with Macrodantin. Final Clinical Impressions(s) / UC Diagnoses   Final diagnoses:  Cystitis     Discharge Instructions      Urinalysis had some white blood cells, nitrites, and blood.  Though you are on your menstrual cycle, this could be a sign of a bladder infection.  Take nitrofurantoin 100 mg--1 capsule 2 times daily for 5 days  Urine culture is sent, and staff will notify you if there is anything that needs to be changed on your antibiotic.     ED Prescriptions     Medication Sig Dispense Auth. Provider   nitrofurantoin, macrocrystal-monohydrate, (MACROBID) 100 MG capsule Take 1 capsule (100 mg total) by mouth 2 (two) times daily. 10 capsule Barrett Henle, MD      PDMP not reviewed this encounter.   Barrett Henle, MD 07/30/22 [phone]

## 2022-08-03 LAB — URINE CULTURE: Culture: >=100000 — AB

## 2023-01-12 ENCOUNTER — Encounter (HOSPITAL_COMMUNITY): Payer: Self-pay

## 2023-01-12 ENCOUNTER — Emergency Department (HOSPITAL_COMMUNITY)
Admission: EM | Admit: 2023-01-12 | Discharge: 2023-01-12 | Disposition: A | Discharge status: Home / Self Care | Payer: Medicaid Other | Attending: Emergency Medicine | Admitting: Emergency Medicine

## 2023-01-12 ENCOUNTER — Emergency Department (HOSPITAL_COMMUNITY): Payer: Medicaid Other

## 2023-01-12 DIAGNOSIS — E860 Dehydration: Secondary | ICD-10-CM | POA: Diagnosis not present

## 2023-01-12 DIAGNOSIS — N12 Tubulo-interstitial nephritis, not specified as acute or chronic: Secondary | ICD-10-CM | POA: Diagnosis not present

## 2023-01-12 DIAGNOSIS — R109 Unspecified abdominal pain: Secondary | ICD-10-CM | POA: Diagnosis present

## 2023-01-12 DIAGNOSIS — D72829 Elevated white blood cell count, unspecified: Secondary | ICD-10-CM | POA: Insufficient documentation

## 2023-01-12 LAB — CBC WITH DIFFERENTIAL/PLATELET
Abs Immature Granulocytes: 0.08 10*3/uL — ABNORMAL HIGH (ref 0.00–0.07)
Basophils Absolute: 0 10*3/uL (ref 0.0–0.1)
Basophils Relative: 0 %
Eosinophils Absolute: 0 10*3/uL (ref 0.0–0.5)
Eosinophils Relative: 0 %
HCT: 41.5 % (ref 36.0–46.0)
Hemoglobin: 14 g/dL (ref 12.0–15.0)
Immature Granulocytes: 1 %
Lymphocytes Relative: 8 %
Lymphs Abs: 1.3 10*3/uL (ref 0.7–4.0)
MCH: 29.9 pg (ref 26.0–34.0)
MCHC: 33.7 g/dL (ref 30.0–36.0)
MCV: 88.5 fL (ref 80.0–100.0)
Monocytes Absolute: 1.9 10*3/uL — ABNORMAL HIGH (ref 0.1–1.0)
Monocytes Relative: 11 %
Neutro Abs: 13.7 10*3/uL — ABNORMAL HIGH (ref 1.7–7.7)
Neutrophils Relative %: 80 %
Platelets: 352 10*3/uL (ref 150–400)
RBC: 4.69 MIL/uL (ref 3.87–5.11)
RDW: 14 % (ref 11.5–15.5)
WBC: 17 10*3/uL — ABNORMAL HIGH (ref 4.0–10.5)
nRBC: 0 % (ref 0.0–0.2)

## 2023-01-12 LAB — COMPREHENSIVE METABOLIC PANEL
ALT: 20 U/L (ref 0–44)
AST: 19 U/L (ref 15–41)
Albumin: 4.1 g/dL (ref 3.5–5.0)
Alkaline Phosphatase: 81 U/L (ref 38–126)
Anion gap: 12 (ref 5–15)
BUN: 8 mg/dL (ref 6–20)
CO2: 20 mmol/L — ABNORMAL LOW (ref 22–32)
Calcium: 8.9 mg/dL (ref 8.9–10.3)
Chloride: 100 mmol/L (ref 98–111)
Creatinine, Ser: 0.9 mg/dL (ref 0.44–1.00)
GFR, Estimated: >60 mL/min (ref >60)
Glucose, Bld: 118 mg/dL — ABNORMAL HIGH (ref 70–99)
Potassium: 3.5 mmol/L (ref 3.5–5.1)
Sodium: 132 mmol/L — ABNORMAL LOW (ref 135–145)
Total Bilirubin: 1.3 mg/dL — ABNORMAL HIGH (ref 0.3–1.2)
Total Protein: 8.7 g/dL — ABNORMAL HIGH (ref 6.5–8.1)

## 2023-01-12 LAB — URINALYSIS, ROUTINE W REFLEX MICROSCOPIC
Bilirubin Urine: NEGATIVE
Glucose, UA: NEGATIVE mg/dL
Ketones, ur: 20 mg/dL — AB
Nitrite: POSITIVE — AB
Protein, ur: 30 mg/dL — AB
RBC / HPF: >50 RBC/hpf (ref 0–5)
Specific Gravity, Urine: 1.012 (ref 1.005–1.030)
WBC, UA: >50 WBC/hpf (ref 0–5)
pH: 6 (ref 5.0–8.0)

## 2023-01-12 LAB — PREGNANCY, URINE: Preg Test, Ur: NEGATIVE

## 2023-01-12 MED ORDER — CIPROFLOXACIN HCL 500 MG PO TABS: 500.0000 mg | ORAL_TABLET | Freq: Two times a day (BID) | ORAL | 0 refills | Status: AC

## 2023-01-12 MED ORDER — SODIUM CHLORIDE 0.9 % IV BOLUS
1000.0000 mL | Freq: Once | INTRAVENOUS | Status: AC
  Administered 2023-01-12: 1000 mL via INTRAVENOUS

## 2023-01-12 MED ORDER — ONDANSETRON HCL 4 MG/2ML IJ SOLN
4.0000 mg | Freq: Once | INTRAMUSCULAR | Status: AC
  Administered 2023-01-12: 4 mg via INTRAVENOUS
  Filled 2023-01-12: qty 2

## 2023-01-12 MED ORDER — ACETAMINOPHEN 500 MG PO TABS
1000.0000 mg | ORAL_TABLET | Freq: Once | ORAL | Status: AC
  Administered 2023-01-12: 1000 mg via ORAL
  Filled 2023-01-12: qty 2

## 2023-01-12 MED ORDER — SODIUM CHLORIDE 0.9 % IV SOLN
1.0000 g | Freq: Once | INTRAVENOUS | Status: AC
  Administered 2023-01-12: 1 g via INTRAVENOUS
  Filled 2023-01-12: qty 10

## 2023-01-12 NOTE — ED Triage Notes (Signed)
Pt arrive POV. Endorses hx of kidney infections. States Flank pain-R side, nausea and fever with  frequent urination that started lastnight

## 2023-01-12 NOTE — ED Provider Notes (Signed)
[place] EMERGENCY DEPARTMENT AT [facility] Provider Note   CSN: [id number] Arrival date & time: 01/12/23  1110     History Chief Complaint  Patient presents with   Flank Pain         Connie Duffy is a 32 y.o. female.  Patient presents emergency department concerns of right-sided flank pain.  History of multiple cases of pyelonephritis.  Reports has been experiencing nausea, fever and increased urinary frequency for the last several days.  She denies any vomiting or diarrhea.  Has not noted any obvious temperatures at home but does endorse some subjective fevers.  Denies any sick contacts and denies any cough, congestion, shortness of breath or chest pain.  States that her urine is currently a little bit darker than typical when she has the increased frequency but denies any significant dysuria.  Has not appreciated any obvious blood.  Denies any chance of pregnancy at this time.   Flank Pain       Home Medications Prior to Admission medications   Medication Sig Start Date End Date Taking? Authorizing Provider  ciprofloxacin (CIPRO) 500 MG tablet Take 1 tablet (500 mg total) by mouth 2 (two) times daily for 7 days. 01/12/23 01/19/23 Yes Smitty Knudsen, PA-C  nitrofurantoin, macrocrystal-monohydrate, (MACROBID) 100 MG capsule Take 1 capsule (100 mg total) by mouth 2 (two) times daily. 07/30/22   Zenia Resides, MD  SPRINTEC 28 0.25-35 MG-MCG tablet Take 1 tablet by mouth daily. 07/05/22   [provider]      Allergies    Patient has no known allergies.    Review of Systems   Review of Systems  Genitourinary:  Positive for flank pain.  All other systems reviewed and are negative.   Physical Exam Updated Vital Signs BP 115/67   Pulse 93   Temp 98 F (36.7 C) (Oral)   Resp 18   LMP 02/20/2022 (Within Days)   SpO2 99%  Physical Exam Vitals and nursing note reviewed.  Constitutional:      General: She is not in acute distress.     Appearance: She is well-developed.  HENT:     Head: Normocephalic and atraumatic.  Eyes:     Conjunctiva/sclera: Conjunctivae normal.  Cardiovascular:     Rate and Rhythm: Normal rate and regular rhythm.     Heart sounds: No murmur heard. Pulmonary:     Effort: Pulmonary effort is normal. No respiratory distress.     Breath sounds: Normal breath sounds.  Abdominal:     General: Abdomen is flat. Bowel sounds are normal.     Palpations: Abdomen is soft.     Tenderness: There is no abdominal tenderness. There is right CVA tenderness and left CVA tenderness.  Musculoskeletal:        General: No swelling.     Cervical back: Neck supple.  Skin:    General: Skin is warm and dry.     Capillary Refill: Capillary refill takes less than 2 seconds.  Neurological:     Mental Status: She is alert.  Psychiatric:        Mood and Affect: Mood normal.     ED Results / Procedures / Treatments   Labs (all labs ordered are listed, but only abnormal results are displayed) Labs Reviewed  CBC WITH DIFFERENTIAL/PLATELET - Abnormal; Notable for the following components:      Result Value   WBC 17.0 (*)    Neutro Abs 13.7 (*)  Monocytes Absolute 1.9 (*)    Abs Immature Granulocytes 0.08 (*)    All other components within normal limits  COMPREHENSIVE METABOLIC PANEL - Abnormal; Notable for the following components:   Sodium 132 (*)    CO2 20 (*)    Glucose, Bld 118 (*)    Total Protein 8.7 (*)    Total Bilirubin 1.3 (*)    All other components within normal limits  URINALYSIS, ROUTINE W REFLEX MICROSCOPIC - Abnormal; Notable for the following components:   APPearance CLOUDY (*)    Hgb urine dipstick LARGE (*)    Ketones, ur 20 (*)    Protein, ur 30 (*)    Nitrite POSITIVE (*)    Leukocytes,Ua LARGE (*)    Bacteria, UA MANY (*)    All other components within normal limits  URINE CULTURE  PREGNANCY, URINE  URINALYSIS, W/ REFLEX TO CULTURE (INFECTION SUSPECTED)     EKG None  Radiology CT Renal Stone Study  Result Date: 01/12/2023 CLINICAL DATA:  Right-sided flank pain. Nausea. Fever. Urinary frequency. EXAM: CT ABDOMEN AND PELVIS WITHOUT CONTRAST TECHNIQUE: Multidetector CT imaging of the abdomen and pelvis was performed following the standard protocol without IV contrast. RADIATION DOSE REDUCTION: This exam was performed according to the departmental dose-optimization program which includes automated exposure control, adjustment of the mA and/or kV according to patient size and/or use of iterative reconstruction technique. COMPARISON:  None Available. FINDINGS: Lower chest: No acute findings. Hepatobiliary: No mass visualized on this unenhanced exam. Gallbladder is unremarkable. No evidence of biliary ductal dilatation. Pancreas: No mass or inflammatory process visualized on this unenhanced exam. Spleen:  Within normal limits in size. Adrenals/Urinary tract: No evidence of urolithiasis or hydronephrosis. Mild right renal atrophy and scarring noted. Mild asymmetric left perinephric stranding is seen, raising suspicion for pyelonephritis. Unremarkable unopacified urinary bladder. Stomach/Bowel: No evidence of obstruction, inflammatory process, or abnormal fluid collections. Normal appendix visualized. Vascular/Lymphatic: No pathologically enlarged lymph nodes identified. No evidence of abdominal aortic aneurysm. Reproductive: Normal size uterus. 8.9 x 6.2 cm right adnexal mass is seen which contains a large amount of fat and calcifications, consistent with a benign ovarian dermoid. No evidence of inflammatory process or free fluid. Other:  None. Musculoskeletal:  No suspicious bone lesions identified. IMPRESSION: Mild asymmetric left perinephric stranding, raising suspicion for pyelonephritis. Recommend correlation with urinalysis. No evidence of urolithiasis or hydronephrosis. 8.9 cm benign right ovarian dermoid. Electronically Signed   By: Danae Orleans M.D.   On:  01/12/2023 16:44    Procedures Procedures   Medications Ordered in ED Medications  acetaminophen (TYLENOL) tablet 1,000 mg (1,000 mg Oral Given 01/12/23 1339)  sodium chloride 0.9 % bolus 1,000 mL (0 mLs Intravenous Stopped 01/12/23 1729)  ondansetron (ZOFRAN) injection 4 mg (4 mg Intravenous Given 01/12/23 1508)  cefTRIAXone (ROCEPHIN) 1 g in sodium chloride 0.9 % 100 mL IVPB (0 g Intravenous Stopped 01/12/23 1729)    ED Course/ Medical Decision Making/ A&P                               Medical Decision Making Amount and/or Complexity of Data Reviewed Labs: ordered. Radiology: ordered.  Risk OTC drugs. Prescription drug management.   This patient presents to the ED for concern of flank pain.  Differential diagnosis includes acute cystitis, pyelonephritis, urolithiasis, bowel obstruction, appendicitis   Lab Tests:  I Ordered, and personally interpreted labs.  The pertinent results include: Leukocytosis at 17.0,  CMP with mild dehydration with sodium at 132, urinalysis without any signs of infection as there is large hemoglobin, nitrites, leukocytes, and bacteria present.  Urine culture pending.   Imaging Studies ordered:  I ordered imaging studies including CT renal stone study I independently visualized and interpreted imaging which showed evidence of pyelonephritis on the right side but no signs of hydronephrosis due to urolithiasis. I agree with the radiologist interpretation   Medicines ordered and prescription drug management:  I ordered medication including fluids, Tylenol, Zofran, Rocephin for dehydration, pain, fever, nausea, pyelonephritis Reevaluation of the patient after these medicines showed that the patient improved I have reviewed the patients home medicines and have made adjustments as needed   Problem List / ED Course:  Patient presents to the emergency department concerns of bilateral flank pain but worse on right side.  States that she is experiencing  some associated nausea, fever, and frequent urgency.  States that this started last night and has not had any improvement.  Prior history of significant pyelonephritis infections denies any history of kidney stones.  Denies any chance of pregnancy at this time.  No prior evaluation with urology.  Plan on workup for suspected pyelonephritis given the patient is tachycardic and with a slightly elevated temperature at 99.4 F.  Asked evaluate for possible renal stone as patient is having abnormal right-sided CVA tenderness but this could also just be a byproduct of pyelonephritis. Lab workup significant for infection with white count at 17 with associated analysis showing infection of the urinary system.  Given area of pain as well as confirmation with CT imaging, patient appears to have acute pyelonephritis.  CMP appointment with dehydration noted send fluids administered.  Urine pregnancy negative and urine culture pending at time of discharge.  Patient given 1 dose of Rocephin here in the emergency department.  Prescription for ciprofloxacin sent to patient's pharmacy continue for the next 7 days.  Discussed strict return precautions the patient.  Patient agreeable to treatment plan.  All questions answered prior to patient discharge.  Patient discharged home in stable condition.  Final Clinical Impression(s) / ED Diagnoses Final diagnoses:  Pyelonephritis    Rx / DC Orders ED Discharge Orders          Ordered    ciprofloxacin (CIPRO) 500 MG tablet  2 times daily        01/12/23 1750              Smitty Knudsen, PA-C 01/12/23 1754    Linwood Dibbles, MD 01/13/23 1350

## 2023-01-12 NOTE — Discharge Instructions (Signed)
You are seen in the emergency department for concerns of flank pain.  Your labs and imaging appear to be consistent with signs of pyelonephritis or an infection in your kidney.  You are given a dose of Rocephin here in the emergency department and prescription sent to your pharmacy to take for the next week.  If you have any worsening or symptoms please return the emergency department.  Given that you have had multiple episodes of pyelonephritis in the last 1 to 2 years, I would recommend following up with a urologist for further evaluation as to why this may be occurring.

## 2023-01-12 NOTE — ED Provider Triage Note (Signed)
Emergency Medicine Provider Triage Evaluation Note  Connie Duffy , a 32 y.o. female  was evaluated in triage.  Pt complains of uti symptoms  Review of Systems  Positive: Fever and back pain Negative: Abdominal pain  Physical Exam  BP (!) 141/64 (BP Location: Right Arm)   Pulse (!) 128   Temp 99.4 F (37.4 C) (Oral)   Resp 16   LMP 02/20/2022 (Within Days)   SpO2 99%  Gen:   Awake, no distress   Resp:  Normal effort  MSK:   Moves extremities without difficulty  Other:    Medical Decision Making  Medically screening exam initiated at 11:30 AM.  Appropriate orders placed.  Connie Duffy was informed that the remainder of the evaluation will be completed by another provider, this initial triage assessment does not replace that evaluation, and the importance of remaining in the ED until their evaluation is complete.     Connie Duffy, [place] 01/12/23 1131

## 2023-01-14 LAB — URINE CULTURE
Culture: >=100000 — AB
Special Requests: NORMAL

## 2023-01-15 ENCOUNTER — Telehealth (HOSPITAL_BASED_OUTPATIENT_CLINIC_OR_DEPARTMENT_OTHER): Payer: Self-pay | Admitting: Emergency Medicine

## 2023-01-15 NOTE — Telephone Encounter (Signed)
Post ED Visit - Positive Culture Follow-up: Successful Patient Follow-Up  Culture assessed and recommendations reviewed by:  []  Enzo Bi, Pharm.D. []  Celedonio Miyamoto, Pharm.D., BCPS AQ-ID []  Garvin Fila, Pharm.D., BCPS []  Georgina Pillion, Pharm.D., BCPS []  [place], [address].D., BCPS, AAHIVP []  Estella Husk, Pharm.D., BCPS, AAHIVP []  Lysle Pearl, PharmD, BCPS []  Phillips Climes, PharmD, BCPS []  Agapito Games, PharmD, BCPS [x]  Stephenie Acres, PharmD  Positive urine culture  []  Patient discharged without antimicrobial prescription and treatment is now indicated [x]  Organism is resistant to prescribed ED discharge antimicrobial []  Patient with positive blood cultures  Changes discussed with ED provider: Riki Sheer PA New antibiotic prescription Cefadroxil one gram PO BID for five days Called to [facility] [address])  [phone]  Contacted patient, date 01/15/23, time 1500   Connie Duffy Connie Duffy 01/15/2023, 3:25 PM

## 2023-01-15 NOTE — Progress Notes (Signed)
ED Antimicrobial Stewardship Positive Culture Follow Up   Connie Duffy is an 32 y.o. female who presented to [facility] on 01/12/2023 with a chief complaint of  Chief Complaint  Patient presents with   Flank Pain         Recent Results (from the past 720 hour(s))  Urine Culture     Status: Abnormal   Collection Time: 01/12/23  1:44 PM   Specimen: Urine, Clean Catch  Result Value Ref Range Status   Specimen Description   Final    URINE, CLEAN CATCH Performed at [facility], [address] [place], [place] [address]    Special Requests   Final    Normal Performed at [facility], [address] [place], [place] [address]    Culture >=100,000 COLONIES/mL ESCHERICHIA COLI (A)  Final   Report Status 01/14/2023 FINAL  Final   Organism ID, Bacteria ESCHERICHIA COLI (A)  Final      Susceptibility   Escherichia coli - MIC*    AMPICILLIN <=2 SENSITIVE Sensitive     CEFAZOLIN <=4 SENSITIVE Sensitive     CEFEPIME <=0.12 SENSITIVE Sensitive     CEFTRIAXONE <=0.25 SENSITIVE Sensitive     CIPROFLOXACIN <=0.25 SENSITIVE Sensitive     GENTAMICIN <=1 SENSITIVE Sensitive     IMIPENEM <=0.25 SENSITIVE Sensitive     NITROFURANTOIN <=16 SENSITIVE Sensitive     TRIMETH/SULFA <=20 SENSITIVE Sensitive     AMPICILLIN/SULBACTAM <=2 SENSITIVE Sensitive     PIP/TAZO <=4 SENSITIVE Sensitive     * >=100,000 COLONIES/mL ESCHERICHIA COLI   Patient presented with right sided flank pain, nausea, fever, and urinary frequency. UA and CT positive for pyelonephritis. She received ceftriaxone x1 dose in the ED and was discharged with seven days of PO Cipro. Urine culture resulted with pan-sensitive Ecoli, and due to the multitude of side effects of fluoroquinolones, switching to a more benign antibiotic is reasonable to finish the course of treatment.   Recommendation: Stop Ciprofloxacin Start new antibiotic prescription: Cefadroxil 1g PO BID for 5 days   ED  Provider: Gareth Eagle, PA  Stephenie Acres, PharmD PGY1 Pharmacy Resident 01/15/2023 9:50 AM

## 2023-11-14 ENCOUNTER — Encounter (HOSPITAL_COMMUNITY): Payer: Self-pay

## 2023-11-14 ENCOUNTER — Ambulatory Visit (HOSPITAL_COMMUNITY): Admission: EM | Admit: 2023-11-14 | Discharge: 2023-11-14 | Disposition: A | Discharge status: Home / Self Care

## 2023-11-14 DIAGNOSIS — Z3202 Encounter for pregnancy test, result negative: Secondary | ICD-10-CM | POA: Diagnosis not present

## 2023-11-14 DIAGNOSIS — N3001 Acute cystitis with hematuria: Secondary | ICD-10-CM | POA: Insufficient documentation

## 2023-11-14 LAB — POCT URINALYSIS DIP (MANUAL ENTRY)
Glucose, UA: 500 mg/dL — AB
Nitrite, UA: NEGATIVE
Protein Ur, POC: 100 mg/dL — AB
Spec Grav, UA: 1.015 (ref 1.010–1.025)
Urobilinogen, UA: 4 U/dL — AB
pH, UA: 6 (ref 5.0–8.0)

## 2023-11-14 LAB — POCT URINE PREGNANCY: Preg Test, Ur: NEGATIVE

## 2023-11-14 MED ORDER — SULFAMETHOXAZOLE-TRIMETHOPRIM 800-160 MG PO TABS: 1.0000 | ORAL_TABLET | Freq: Two times a day (BID) | ORAL | 0 refills | Status: AC

## 2023-11-14 NOTE — ED Triage Notes (Signed)
 Patient presenting with low back pain, dark urine, and nausea onset This past Friday. Also having night sweats and going to the bathroom often. States she is prone to UTIs and she does hold it a lot.  Prescriptions or OTC medications tried: Yes- Ibuprofen  800   with moderate relief of pain

## 2023-11-14 NOTE — ED Provider Notes (Signed)
 [facility]  Note:  This document was prepared using Dragon voice recognition software and may include unintentional dictation errors.  MRN: [id number] DOB: 1991/03/31  Subjective:   Connie Duffy is a 33 y.o. female presenting for lower back pain, dark urine, mild nausea, suprapubic abdominal pressure and increased urinary frequency x 2 to 3 days.  Patient reports past history of several urinary tract infections however she has not had a UTI in the past year.  Patient denies any history of resistant UTIs.  No dysuria, no flank pain, no severe abdominal pain, no vaginal discharge or itching.  No current facility-administered medications for this encounter.  Current Outpatient Medications:    sulfamethoxazole -trimethoprim  (BACTRIM  DS) 800-160 MG tablet, Take 1 tablet by mouth 2 (two) times daily for 7 days., Disp: 14 tablet, Rfl: 0   No Known Allergies  Past Medical History:  Diagnosis Date   Abnormal Pap smear    Medical history non-contributory    Pyelonephritis    Recurrent UTI    Sickle cell trait (HCC)      Past Surgical History:  Procedure Laterality Date   COLPOSCOPY      Family History  Problem Relation Age of Onset   Healthy Mother    Healthy Father    Diabetes Maternal Aunt    Diabetes Maternal Grandfather     Social History   Tobacco Use   Smoking status: Former    Types: Cigars    Quit date: 09/10/2009    Years since quitting: 14.1   Smokeless tobacco: Never  Vaping Use   Vaping status: Never Used  Substance Use Topics   Alcohol use: Not Currently    Comment: occasional   Drug use: No    ROS Refer to HPI for ROS details.  Objective:   Vitals: BP 117/79 (BP Location: Right Arm)   Pulse 90   Temp 97.9 F (36.6 C) (Oral)   Resp 18   Ht 5' 5 (1.651 m)   Wt 147 lb (66.7 kg)   LMP 10/26/2023 (Approximate)   SpO2 96%   Breastfeeding No   BMI 24.46 kg/m   Physical Exam Vitals and nursing note reviewed.  Constitutional:       General: She is not in acute distress.    Appearance: Normal appearance. She is well-developed. She is not ill-appearing or toxic-appearing.  HENT:     Head: Normocephalic and atraumatic.  Cardiovascular:     Rate and Rhythm: Normal rate.  Pulmonary:     Effort: Pulmonary effort is normal. No respiratory distress.  Abdominal:     General: There is no distension.     Palpations: Abdomen is soft.     Tenderness: There is no abdominal tenderness. There is no right CVA tenderness, left CVA tenderness, guarding or rebound.  Skin:    General: Skin is warm and dry.  Neurological:     General: No focal deficit present.     Mental Status: She is alert and oriented to person, place, and time.  Psychiatric:        Mood and Affect: Mood normal.        Behavior: Behavior normal.     Procedures  Results for orders placed or performed during the hospital encounter of 11/14/23 (from the past 24 hours)  POC urinalysis dipstick     Status: Abnormal   Collection Time: 11/14/23 10:59 AM  Result Value Ref Range   Color, UA orange (A) yellow   Clarity, UA turbid (A)  clear   Glucose, UA =500 (A) negative mg/dL   Bilirubin, UA small (A) negative   Ketones, POC UA small (15) (A) negative mg/dL   Spec Grav, UA 8.984 8.989 - 1.025   Blood, UA large (A) negative   pH, UA 6.0 5.0 - 8.0   Protein Ur, POC =100 (A) negative mg/dL   Urobilinogen, UA 4.0 (A) 0.2 or 1.0 E.U./dL   Nitrite, UA Negative Negative   Leukocytes, UA Large (3+) (A) Negative  POCT urine pregnancy     Status: None   Collection Time: 11/14/23 11:01 AM  Result Value Ref Range   Preg Test, Ur Negative Negative    No results found.   Assessment and Plan :     Discharge Instructions       1. Acute cystitis with hematuria (Primary) - POC urinalysis dipstick completed in UC shows large leukocytes, no nitrite, large blood, these findings are strongly indicative of urinary tract infection - POCT urine pregnancy completed  UC is negative - Urine Culture collected in UC and sent to lab for further testing results should be available in 2 to 3 days if there is any abnormality noted patient will be contacted and appropriate treatment will be provided - sulfamethoxazole -trimethoprim  (BACTRIM  DS) 800-160 MG tablet; Take 1 tablet by mouth 2 (two) times daily for 7 days.  Dispense: 14 tablet; Refill: 0 -Continue to monitor symptoms for any change in severity if there is any escalation of current symptoms or development of new symptoms follow-up in UC or ER for further evaluation and management.      Daisia Slomski B Tyrann Donaho   Tomer Chalmers, [place], [place] 11/14/23 1118

## 2023-11-14 NOTE — Discharge Instructions (Addendum)
  1. Acute cystitis with hematuria (Primary) - POC urinalysis dipstick completed in UC shows large leukocytes, no nitrite, large blood, these findings are strongly indicative of urinary tract infection - POCT urine pregnancy completed UC is negative - Urine Culture collected in UC and sent to lab for further testing results should be available in 2 to 3 days if there is any abnormality noted patient will be contacted and appropriate treatment will be provided - sulfamethoxazole -trimethoprim  (BACTRIM  DS) 800-160 MG tablet; Take 1 tablet by mouth 2 (two) times daily for 7 days.  Dispense: 14 tablet; Refill: 0 -Continue to monitor symptoms for any change in severity if there is any escalation of current symptoms or development of new symptoms follow-up in UC or ER for further evaluation and management.

## 2023-11-16 ENCOUNTER — Ambulatory Visit (HOSPITAL_COMMUNITY): Payer: Self-pay

## 2023-11-16 LAB — URINE CULTURE
Culture: >=100000 — AB
Special Requests: NORMAL

## 2024-04-18 ENCOUNTER — Encounter (HOSPITAL_COMMUNITY): Payer: Self-pay

## 2024-04-18 ENCOUNTER — Ambulatory Visit (HOSPITAL_COMMUNITY)
Admission: EM | Admit: 2024-04-18 | Discharge: 2024-04-18 | Disposition: A | Discharge status: Home / Self Care | Attending: Internal Medicine | Admitting: Internal Medicine

## 2024-04-18 DIAGNOSIS — J069 Acute upper respiratory infection, unspecified: Secondary | ICD-10-CM | POA: Diagnosis not present

## 2024-04-18 DIAGNOSIS — R110 Nausea: Secondary | ICD-10-CM

## 2024-04-18 LAB — POC COVID19/FLU A&B COMBO
Covid Antigen, POC: NEGATIVE
Influenza A Antigen, POC: NEGATIVE
Influenza B Antigen, POC: NEGATIVE

## 2024-04-18 MED ORDER — ACETAMINOPHEN 325 MG PO TABS
650.0000 mg | ORAL_TABLET | Freq: Once | ORAL | Status: AC
  Administered 2024-04-18: 650 mg via ORAL

## 2024-04-18 MED ORDER — ACETAMINOPHEN 325 MG PO TABS
ORAL_TABLET | ORAL | Status: AC
  Filled 2024-04-18: qty 2

## 2024-04-18 MED ORDER — ONDANSETRON 4 MG PO TBDP
ORAL_TABLET | ORAL | Status: AC
  Filled 2024-04-18: qty 1

## 2024-04-18 MED ORDER — IBUPROFEN 600 MG PO TABS: 600.0000 mg | ORAL_TABLET | Freq: Four times a day (QID) | ORAL | 0 refills | Status: AC | PRN

## 2024-04-18 MED ORDER — ONDANSETRON 4 MG PO TBDP: 4.0000 mg | ORAL_TABLET | Freq: Three times a day (TID) | ORAL | 0 refills | Status: AC | PRN

## 2024-04-18 MED ORDER — ONDANSETRON 4 MG PO TBDP
4.0000 mg | ORAL_TABLET | Freq: Once | ORAL | Status: AC
  Administered 2024-04-18: 4 mg via ORAL

## 2024-04-18 NOTE — ED Provider Notes (Signed)
 " [facility]    CSN: [id number] Arrival date & time: 04/18/24  1518      History   Chief Complaint Chief Complaint  Patient presents with   Generalized Body Aches   Headache   Chills   Fever   Cough    HPI Connie Duffy is a 33 y.o. female.   33 year old female presents urgent care with complaints of fever, nausea, body aches, headache, chills and cough.  She reports that her symptoms started about 2 days ago.  Initially she did have a forceful cough but the cough is not as bad now.  She is having a lot of nausea and having a hard time eating due to the nausea but she is not having any vomiting.  She also has bodyaches, fever, chills and headache.  She has been exposed to other people are sick at work.  She denies any shortness of breath, sore throat, ear pain.   Headache Associated symptoms: cough, fatigue, fever, myalgias and nausea   Associated symptoms: no abdominal pain, no back pain, no ear pain, no eye pain, no seizures, no sore throat and no vomiting   Fever Associated symptoms: chills, cough, headaches, myalgias and nausea   Associated symptoms: no chest pain, no dysuria, no ear pain, no rash, no sore throat and no vomiting   Cough Associated symptoms: chills, fever, headaches and myalgias   Associated symptoms: no chest pain, no ear pain, no rash, no shortness of breath and no sore throat     Past Medical History:  Diagnosis Date   Abnormal Pap smear    Medical history non-contributory    Pyelonephritis    Recurrent UTI    Sickle cell trait     Patient Active Problem List   Diagnosis Date Noted   UTI (urinary tract infection) during pregnancy 05/05/2022   Dermoid cyst of right ovary 04/11/2020    Past Surgical History:  Procedure Laterality Date   COLPOSCOPY      OB History     Gravida  4   Para  2   Term  2   Preterm      AB      Living  2      SAB      IAB      Ectopic      Multiple      Live Births  2             Home Medications    Prior to Admission medications  Medication Sig Start Date End Date Taking? Authorizing Provider  ibuprofen  (ADVIL ) 600 MG tablet Take 1 tablet (600 mg total) by mouth every 6 (six) hours as needed. 04/18/24  Yes Daril Warga A, PA-C  ondansetron  (ZOFRAN -ODT) 4 MG disintegrating tablet Take 1 tablet (4 mg total) by mouth every 8 (eight) hours as needed for nausea or vomiting. 04/18/24  Yes Connie Almarie LABOR, PA-C    Family History Family History  Problem Relation Age of Onset   Healthy Mother    Healthy Father    Diabetes Maternal Aunt    Diabetes Maternal Grandfather     Social History Social History[1]   Allergies   Patient has no known allergies.   Review of Systems Review of Systems  Constitutional:  Positive for appetite change, chills, fatigue and fever.  HENT:  Negative for ear pain and sore throat.   Eyes:  Negative for pain and visual disturbance.  Respiratory:  Positive for cough. Negative  for shortness of breath.   Cardiovascular:  Negative for chest pain and palpitations.  Gastrointestinal:  Positive for nausea. Negative for abdominal pain and vomiting.  Genitourinary:  Negative for dysuria and hematuria.  Musculoskeletal:  Positive for myalgias. Negative for arthralgias and back pain.  Skin:  Negative for color change and rash.  Neurological:  Positive for headaches. Negative for seizures and syncope.  All other systems reviewed and are negative.    Physical Exam Triage Vital Signs ED Triage Vitals  Encounter Vitals Group     BP 04/18/24 1821 114/67     Girls Systolic BP Percentile --      Girls Diastolic BP Percentile --      Boys Systolic BP Percentile --      Boys Diastolic BP Percentile --      Pulse Rate 04/18/24 1821 (!) 122     Resp 04/18/24 1821 16     Temp 04/18/24 1821 (!) 102.9 F (39.4 C)     Temp Source 04/18/24 1821 Oral     SpO2 04/18/24 1821 96 %     Weight --      Height --      Head  Circumference --      Peak Flow --      Pain Score 04/18/24 1820 10     Pain Loc --      Pain Education --      Exclude from Growth Chart --    No data found.  Updated Vital Signs BP 114/67 (BP Location: Left Arm)   Pulse (!) 122   Temp (!) 102.9 F (39.4 C) (Oral)   Resp 16   LMP 04/18/2024   SpO2 96%   Visual Acuity Right Eye Distance:   Left Eye Distance:   Bilateral Distance:    Right Eye Near:   Left Eye Near:    Bilateral Near:     Physical Exam Vitals and nursing note reviewed.  Constitutional:      General: She is not in acute distress.    Appearance: She is well-developed.  HENT:     Head: Normocephalic and atraumatic.     Right Ear: Tympanic membrane normal.     Left Ear: Tympanic membrane normal.     Nose: Nose normal.     Mouth/Throat:     Mouth: Mucous membranes are moist.  Eyes:     Conjunctiva/sclera: Conjunctivae normal.  Cardiovascular:     Rate and Rhythm: Normal rate and regular rhythm.     Heart sounds: No murmur heard. Pulmonary:     Effort: Pulmonary effort is normal. No respiratory distress.     Breath sounds: Normal breath sounds.  Abdominal:     Palpations: Abdomen is soft.     Tenderness: There is no abdominal tenderness. There is no guarding.  Musculoskeletal:        General: No swelling.     Cervical back: Neck supple.  Skin:    General: Skin is warm and dry.     Capillary Refill: Capillary refill takes less than 2 seconds.  Neurological:     General: No focal deficit present.     Mental Status: She is alert and oriented to person, place, and time.     Cranial Nerves: No cranial nerve deficit.  Psychiatric:        Mood and Affect: Mood normal.      UC Treatments / Results  Labs (all labs ordered are listed, but only abnormal results are displayed)  Labs Reviewed  POC COVID19/FLU A&B COMBO    EKG   Radiology No results found.  Procedures Procedures (including critical care time)  Medications Ordered in  UC Medications  acetaminophen  (TYLENOL ) tablet 650 mg (650 mg Oral Given 04/18/24 1828)  ondansetron  (ZOFRAN -ODT) disintegrating tablet 4 mg (4 mg Oral Given 04/18/24 1906)    Initial Impression / Assessment and Plan / UC Course  I have reviewed the triage vital signs and the nursing notes.  Pertinent labs & imaging results that were available during my care of the patient were reviewed by me and considered in my medical decision making (see chart for details).     Viral upper respiratory tract infection with cough  Nausea without vomiting   Flu A, flu B and COVID testing done today is negative. Symptoms are most consistent with a viral infection.  This does not require antibiotic treatment.  We focus treatment on improving the symptoms.  We will treat with the following:  Ibuprofen  600 mg every 6 hours as needed for fever or pain.  Alternate this with Tylenol  650 mg every 6 hours for fever and pain. Zofran  4 mg orally disintegrating tablet every 8 hours as needed for nausea.  First dose given in clinic today.  If needed you may take another dose in 8 hours. Rest and Make sure to stay hydrated by drinking plenty of water. Avoid being around large groups of people until you are 24 hours without a fever without fever reducing medications.  This also applies to returning to work. Return to urgent care or PCP if symptoms worsen or fail to resolve.    Final Clinical Impressions(s) / UC Diagnoses   Final diagnoses:  Viral upper respiratory tract infection with cough  Nausea without vomiting     Discharge Instructions      Flu A, flu B and COVID testing done today is negative. Symptoms are most consistent with a viral infection.  This does not require antibiotic treatment.  We focus treatment on improving the symptoms.  We will treat with the following:  Ibuprofen  600 mg every 6 hours as needed for fever or pain.  Alternate this with Tylenol  650 mg every 6 hours for fever and  pain. Zofran  4 mg orally disintegrating tablet every 8 hours as needed for nausea.  First dose given in clinic today.  If needed you may take another dose in 8 hours. Rest and Make sure to stay hydrated by drinking plenty of water. Avoid being around large groups of people until you are 24 hours without a fever without fever reducing medications.  This also applies to returning to work. Return to urgent care or PCP if symptoms worsen or fail to resolve.      ED Prescriptions     Medication Sig Dispense Auth. Provider   ibuprofen  (ADVIL ) 600 MG tablet Take 1 tablet (600 mg total) by mouth every 6 (six) hours as needed. 30 tablet Akesha Uresti A, PA-C   ondansetron  (ZOFRAN -ODT) 4 MG disintegrating tablet Take 1 tablet (4 mg total) by mouth every 8 (eight) hours as needed for nausea or vomiting. 20 tablet Connie Almarie LABOR, [place]      PDMP not reviewed this encounter.    [1]  Social History Tobacco Use   Smoking status: Former    Types: Cigars    Quit date: 09/10/2009    Years since quitting: 14.6   Smokeless tobacco: Never  Vaping Use   Vaping status: Never Used  Substance Use  Topics   Alcohol use: Yes    Comment: occasional   Drug use: No     Connie Duffy 04/18/24 1908  "

## 2024-04-18 NOTE — Discharge Instructions (Addendum)
 Flu A, flu B and COVID testing done today is negative. Symptoms are most consistent with a viral infection.  This does not require antibiotic treatment.  We focus treatment on improving the symptoms.  We will treat with the following:  Ibuprofen  600 mg every 6 hours as needed for fever or pain.  Alternate this with Tylenol  650 mg every 6 hours for fever and pain. Zofran  4 mg orally disintegrating tablet every 8 hours as needed for nausea.  First dose given in clinic today.  If needed you may take another dose in 8 hours. Rest and Make sure to stay hydrated by drinking plenty of water. Avoid being around large groups of people until you are 24 hours without a fever without fever reducing medications.  This also applies to returning to work. Return to urgent care or PCP if symptoms worsen or fail to resolve.

## 2024-04-18 NOTE — ED Triage Notes (Signed)
 Patient c/o body aches, headache, chills, fever, and a non productive cough x 2 days.   Patient states she has been taking Aspirin for pain.
